# Patient Record
Sex: Male | Born: 1940 | Race: White | Hispanic: No | Marital: Single | Smoking: Never smoker
Health system: Southern US, Community
[De-identification: ages and names within clinical notes are randomized; demographics above are authoritative.]

## PROBLEM LIST (undated history)

## (undated) DIAGNOSIS — I4891 Unspecified atrial fibrillation: Secondary | ICD-10-CM

## (undated) DIAGNOSIS — M5430 Sciatica, unspecified side: Secondary | ICD-10-CM

## (undated) DIAGNOSIS — E119 Type 2 diabetes mellitus without complications: Secondary | ICD-10-CM

## (undated) DIAGNOSIS — I10 Essential (primary) hypertension: Secondary | ICD-10-CM

## (undated) DIAGNOSIS — I639 Cerebral infarction, unspecified: Secondary | ICD-10-CM

## (undated) HISTORY — PX: CHOLECYSTECTOMY: SHX55

---

## 2011-08-19 DIAGNOSIS — I10 Essential (primary) hypertension: Secondary | ICD-10-CM | POA: Diagnosis not present

## 2011-08-19 DIAGNOSIS — E7800 Pure hypercholesterolemia, unspecified: Secondary | ICD-10-CM | POA: Diagnosis not present

## 2011-08-19 DIAGNOSIS — J069 Acute upper respiratory infection, unspecified: Secondary | ICD-10-CM | POA: Diagnosis not present

## 2011-08-20 DIAGNOSIS — D539 Nutritional anemia, unspecified: Secondary | ICD-10-CM | POA: Diagnosis not present

## 2011-08-20 DIAGNOSIS — Z79899 Other long term (current) drug therapy: Secondary | ICD-10-CM | POA: Diagnosis not present

## 2011-08-27 DIAGNOSIS — I731 Thromboangiitis obliterans [Buerger's disease]: Secondary | ICD-10-CM | POA: Diagnosis not present

## 2011-08-27 DIAGNOSIS — M79609 Pain in unspecified limb: Secondary | ICD-10-CM | POA: Diagnosis not present

## 2011-08-27 DIAGNOSIS — E119 Type 2 diabetes mellitus without complications: Secondary | ICD-10-CM | POA: Diagnosis not present

## 2011-12-09 DIAGNOSIS — N289 Disorder of kidney and ureter, unspecified: Secondary | ICD-10-CM | POA: Diagnosis not present

## 2011-12-09 DIAGNOSIS — Z125 Encounter for screening for malignant neoplasm of prostate: Secondary | ICD-10-CM | POA: Diagnosis not present

## 2011-12-09 DIAGNOSIS — E782 Mixed hyperlipidemia: Secondary | ICD-10-CM | POA: Diagnosis not present

## 2012-01-15 DIAGNOSIS — I2510 Atherosclerotic heart disease of native coronary artery without angina pectoris: Secondary | ICD-10-CM | POA: Diagnosis not present

## 2012-01-15 DIAGNOSIS — I5030 Unspecified diastolic (congestive) heart failure: Secondary | ICD-10-CM | POA: Diagnosis not present

## 2012-01-15 DIAGNOSIS — I4892 Unspecified atrial flutter: Secondary | ICD-10-CM | POA: Diagnosis not present

## 2012-01-15 DIAGNOSIS — E119 Type 2 diabetes mellitus without complications: Secondary | ICD-10-CM | POA: Diagnosis not present

## 2012-03-04 DIAGNOSIS — Z23 Encounter for immunization: Secondary | ICD-10-CM | POA: Diagnosis not present

## 2012-03-15 DIAGNOSIS — R269 Unspecified abnormalities of gait and mobility: Secondary | ICD-10-CM | POA: Diagnosis not present

## 2012-03-15 DIAGNOSIS — K219 Gastro-esophageal reflux disease without esophagitis: Secondary | ICD-10-CM | POA: Diagnosis not present

## 2012-03-15 DIAGNOSIS — E1149 Type 2 diabetes mellitus with other diabetic neurological complication: Secondary | ICD-10-CM | POA: Diagnosis not present

## 2012-03-15 DIAGNOSIS — D539 Nutritional anemia, unspecified: Secondary | ICD-10-CM | POA: Diagnosis not present

## 2012-03-16 DIAGNOSIS — I999 Unspecified disorder of circulatory system: Secondary | ICD-10-CM | POA: Diagnosis not present

## 2012-03-16 DIAGNOSIS — R269 Unspecified abnormalities of gait and mobility: Secondary | ICD-10-CM | POA: Diagnosis not present

## 2012-04-20 DIAGNOSIS — IMO0002 Reserved for concepts with insufficient information to code with codable children: Secondary | ICD-10-CM | POA: Diagnosis not present

## 2012-04-20 DIAGNOSIS — E1142 Type 2 diabetes mellitus with diabetic polyneuropathy: Secondary | ICD-10-CM | POA: Diagnosis not present

## 2012-04-20 DIAGNOSIS — R269 Unspecified abnormalities of gait and mobility: Secondary | ICD-10-CM | POA: Diagnosis not present

## 2012-04-21 DIAGNOSIS — M5137 Other intervertebral disc degeneration, lumbosacral region: Secondary | ICD-10-CM | POA: Diagnosis not present

## 2012-04-27 DIAGNOSIS — IMO0002 Reserved for concepts with insufficient information to code with codable children: Secondary | ICD-10-CM | POA: Diagnosis not present

## 2012-04-27 DIAGNOSIS — M25569 Pain in unspecified knee: Secondary | ICD-10-CM | POA: Diagnosis not present

## 2012-04-27 DIAGNOSIS — E1142 Type 2 diabetes mellitus with diabetic polyneuropathy: Secondary | ICD-10-CM | POA: Diagnosis not present

## 2012-04-27 DIAGNOSIS — R269 Unspecified abnormalities of gait and mobility: Secondary | ICD-10-CM | POA: Diagnosis not present

## 2012-04-28 DIAGNOSIS — IMO0002 Reserved for concepts with insufficient information to code with codable children: Secondary | ICD-10-CM | POA: Diagnosis not present

## 2012-04-28 DIAGNOSIS — G722 Myopathy due to other toxic agents: Secondary | ICD-10-CM | POA: Diagnosis not present

## 2012-04-28 DIAGNOSIS — R269 Unspecified abnormalities of gait and mobility: Secondary | ICD-10-CM | POA: Diagnosis not present

## 2012-05-03 DIAGNOSIS — M25569 Pain in unspecified knee: Secondary | ICD-10-CM | POA: Diagnosis not present

## 2012-05-03 DIAGNOSIS — IMO0002 Reserved for concepts with insufficient information to code with codable children: Secondary | ICD-10-CM | POA: Diagnosis not present

## 2012-05-03 DIAGNOSIS — R269 Unspecified abnormalities of gait and mobility: Secondary | ICD-10-CM | POA: Diagnosis not present

## 2012-05-03 DIAGNOSIS — E1142 Type 2 diabetes mellitus with diabetic polyneuropathy: Secondary | ICD-10-CM | POA: Diagnosis not present

## 2012-05-05 DIAGNOSIS — G722 Myopathy due to other toxic agents: Secondary | ICD-10-CM | POA: Diagnosis not present

## 2012-05-05 DIAGNOSIS — R269 Unspecified abnormalities of gait and mobility: Secondary | ICD-10-CM | POA: Diagnosis not present

## 2012-05-05 DIAGNOSIS — IMO0002 Reserved for concepts with insufficient information to code with codable children: Secondary | ICD-10-CM | POA: Diagnosis not present

## 2012-05-06 DIAGNOSIS — M25569 Pain in unspecified knee: Secondary | ICD-10-CM | POA: Diagnosis not present

## 2012-05-06 DIAGNOSIS — IMO0002 Reserved for concepts with insufficient information to code with codable children: Secondary | ICD-10-CM | POA: Diagnosis not present

## 2012-05-06 DIAGNOSIS — E1142 Type 2 diabetes mellitus with diabetic polyneuropathy: Secondary | ICD-10-CM | POA: Diagnosis not present

## 2012-05-06 DIAGNOSIS — R269 Unspecified abnormalities of gait and mobility: Secondary | ICD-10-CM | POA: Diagnosis not present

## 2012-05-09 DIAGNOSIS — E1142 Type 2 diabetes mellitus with diabetic polyneuropathy: Secondary | ICD-10-CM | POA: Diagnosis not present

## 2012-05-09 DIAGNOSIS — IMO0002 Reserved for concepts with insufficient information to code with codable children: Secondary | ICD-10-CM | POA: Diagnosis not present

## 2012-05-09 DIAGNOSIS — R269 Unspecified abnormalities of gait and mobility: Secondary | ICD-10-CM | POA: Diagnosis not present

## 2012-05-09 DIAGNOSIS — G722 Myopathy due to other toxic agents: Secondary | ICD-10-CM | POA: Diagnosis not present

## 2012-05-10 DIAGNOSIS — M25569 Pain in unspecified knee: Secondary | ICD-10-CM | POA: Diagnosis not present

## 2012-05-10 DIAGNOSIS — E1142 Type 2 diabetes mellitus with diabetic polyneuropathy: Secondary | ICD-10-CM | POA: Diagnosis not present

## 2012-05-10 DIAGNOSIS — IMO0002 Reserved for concepts with insufficient information to code with codable children: Secondary | ICD-10-CM | POA: Diagnosis not present

## 2012-05-10 DIAGNOSIS — R269 Unspecified abnormalities of gait and mobility: Secondary | ICD-10-CM | POA: Diagnosis not present

## 2012-05-12 DIAGNOSIS — E1142 Type 2 diabetes mellitus with diabetic polyneuropathy: Secondary | ICD-10-CM | POA: Diagnosis not present

## 2012-05-12 DIAGNOSIS — M25569 Pain in unspecified knee: Secondary | ICD-10-CM | POA: Diagnosis not present

## 2012-05-12 DIAGNOSIS — IMO0002 Reserved for concepts with insufficient information to code with codable children: Secondary | ICD-10-CM | POA: Diagnosis not present

## 2012-05-12 DIAGNOSIS — R269 Unspecified abnormalities of gait and mobility: Secondary | ICD-10-CM | POA: Diagnosis not present

## 2012-05-17 DIAGNOSIS — R269 Unspecified abnormalities of gait and mobility: Secondary | ICD-10-CM | POA: Diagnosis not present

## 2012-05-17 DIAGNOSIS — E1142 Type 2 diabetes mellitus with diabetic polyneuropathy: Secondary | ICD-10-CM | POA: Diagnosis not present

## 2012-05-17 DIAGNOSIS — IMO0002 Reserved for concepts with insufficient information to code with codable children: Secondary | ICD-10-CM | POA: Diagnosis not present

## 2012-05-17 DIAGNOSIS — M25569 Pain in unspecified knee: Secondary | ICD-10-CM | POA: Diagnosis not present

## 2012-05-24 DIAGNOSIS — R269 Unspecified abnormalities of gait and mobility: Secondary | ICD-10-CM | POA: Diagnosis not present

## 2012-05-24 DIAGNOSIS — E1142 Type 2 diabetes mellitus with diabetic polyneuropathy: Secondary | ICD-10-CM | POA: Diagnosis not present

## 2012-05-24 DIAGNOSIS — IMO0002 Reserved for concepts with insufficient information to code with codable children: Secondary | ICD-10-CM | POA: Diagnosis not present

## 2012-05-24 DIAGNOSIS — M25569 Pain in unspecified knee: Secondary | ICD-10-CM | POA: Diagnosis not present

## 2012-05-31 DIAGNOSIS — R269 Unspecified abnormalities of gait and mobility: Secondary | ICD-10-CM | POA: Diagnosis not present

## 2012-05-31 DIAGNOSIS — E1142 Type 2 diabetes mellitus with diabetic polyneuropathy: Secondary | ICD-10-CM | POA: Diagnosis not present

## 2012-05-31 DIAGNOSIS — IMO0002 Reserved for concepts with insufficient information to code with codable children: Secondary | ICD-10-CM | POA: Diagnosis not present

## 2012-05-31 DIAGNOSIS — M25569 Pain in unspecified knee: Secondary | ICD-10-CM | POA: Diagnosis not present

## 2012-06-29 DIAGNOSIS — IMO0002 Reserved for concepts with insufficient information to code with codable children: Secondary | ICD-10-CM | POA: Diagnosis not present

## 2012-06-29 DIAGNOSIS — G722 Myopathy due to other toxic agents: Secondary | ICD-10-CM | POA: Diagnosis not present

## 2012-06-29 DIAGNOSIS — R269 Unspecified abnormalities of gait and mobility: Secondary | ICD-10-CM | POA: Diagnosis not present

## 2012-06-29 DIAGNOSIS — E1142 Type 2 diabetes mellitus with diabetic polyneuropathy: Secondary | ICD-10-CM | POA: Diagnosis not present

## 2012-10-13 DIAGNOSIS — E119 Type 2 diabetes mellitus without complications: Secondary | ICD-10-CM | POA: Diagnosis not present

## 2012-10-13 DIAGNOSIS — E7800 Pure hypercholesterolemia, unspecified: Secondary | ICD-10-CM | POA: Diagnosis not present

## 2012-10-13 DIAGNOSIS — I10 Essential (primary) hypertension: Secondary | ICD-10-CM | POA: Diagnosis not present

## 2012-10-24 DIAGNOSIS — N289 Disorder of kidney and ureter, unspecified: Secondary | ICD-10-CM | POA: Diagnosis not present

## 2012-11-02 DIAGNOSIS — N289 Disorder of kidney and ureter, unspecified: Secondary | ICD-10-CM | POA: Diagnosis not present

## 2012-11-11 DIAGNOSIS — R7989 Other specified abnormal findings of blood chemistry: Secondary | ICD-10-CM | POA: Diagnosis not present

## 2012-12-09 DIAGNOSIS — H524 Presbyopia: Secondary | ICD-10-CM | POA: Diagnosis not present

## 2012-12-09 DIAGNOSIS — E119 Type 2 diabetes mellitus without complications: Secondary | ICD-10-CM | POA: Diagnosis not present

## 2012-12-09 DIAGNOSIS — H52229 Regular astigmatism, unspecified eye: Secondary | ICD-10-CM | POA: Diagnosis not present

## 2012-12-09 DIAGNOSIS — H5200 Hypermetropia, unspecified eye: Secondary | ICD-10-CM | POA: Diagnosis not present

## 2013-02-13 DIAGNOSIS — E119 Type 2 diabetes mellitus without complications: Secondary | ICD-10-CM | POA: Diagnosis not present

## 2013-02-13 DIAGNOSIS — I5030 Unspecified diastolic (congestive) heart failure: Secondary | ICD-10-CM | POA: Diagnosis not present

## 2013-02-13 DIAGNOSIS — I4892 Unspecified atrial flutter: Secondary | ICD-10-CM | POA: Diagnosis not present

## 2013-02-13 DIAGNOSIS — I2510 Atherosclerotic heart disease of native coronary artery without angina pectoris: Secondary | ICD-10-CM | POA: Diagnosis not present

## 2013-02-13 DIAGNOSIS — E785 Hyperlipidemia, unspecified: Secondary | ICD-10-CM | POA: Diagnosis not present

## 2013-03-07 DIAGNOSIS — I2510 Atherosclerotic heart disease of native coronary artery without angina pectoris: Secondary | ICD-10-CM | POA: Diagnosis not present

## 2013-03-07 DIAGNOSIS — I4892 Unspecified atrial flutter: Secondary | ICD-10-CM | POA: Diagnosis not present

## 2013-03-22 DIAGNOSIS — Z23 Encounter for immunization: Secondary | ICD-10-CM | POA: Diagnosis not present

## 2013-06-28 DIAGNOSIS — I10 Essential (primary) hypertension: Secondary | ICD-10-CM | POA: Diagnosis not present

## 2013-06-28 DIAGNOSIS — IMO0001 Reserved for inherently not codable concepts without codable children: Secondary | ICD-10-CM | POA: Diagnosis not present

## 2013-06-28 DIAGNOSIS — E7800 Pure hypercholesterolemia, unspecified: Secondary | ICD-10-CM | POA: Diagnosis not present

## 2013-07-06 DIAGNOSIS — IMO0001 Reserved for inherently not codable concepts without codable children: Secondary | ICD-10-CM | POA: Diagnosis not present

## 2013-07-06 DIAGNOSIS — N189 Chronic kidney disease, unspecified: Secondary | ICD-10-CM | POA: Diagnosis not present

## 2013-07-06 DIAGNOSIS — I10 Essential (primary) hypertension: Secondary | ICD-10-CM | POA: Diagnosis not present

## 2013-08-09 DIAGNOSIS — IMO0001 Reserved for inherently not codable concepts without codable children: Secondary | ICD-10-CM | POA: Diagnosis not present

## 2013-08-09 DIAGNOSIS — N189 Chronic kidney disease, unspecified: Secondary | ICD-10-CM | POA: Diagnosis not present

## 2013-08-09 DIAGNOSIS — I10 Essential (primary) hypertension: Secondary | ICD-10-CM | POA: Diagnosis not present

## 2013-09-06 DIAGNOSIS — IMO0001 Reserved for inherently not codable concepts without codable children: Secondary | ICD-10-CM | POA: Diagnosis not present

## 2013-09-06 DIAGNOSIS — N189 Chronic kidney disease, unspecified: Secondary | ICD-10-CM | POA: Diagnosis not present

## 2013-09-06 DIAGNOSIS — I10 Essential (primary) hypertension: Secondary | ICD-10-CM | POA: Diagnosis not present

## 2013-09-06 DIAGNOSIS — Z0000 Encounter for general adult medical examination without abnormal findings: Secondary | ICD-10-CM | POA: Diagnosis not present

## 2013-09-21 DIAGNOSIS — IMO0001 Reserved for inherently not codable concepts without codable children: Secondary | ICD-10-CM | POA: Diagnosis not present

## 2013-09-21 DIAGNOSIS — H6120 Impacted cerumen, unspecified ear: Secondary | ICD-10-CM | POA: Diagnosis not present

## 2013-09-21 DIAGNOSIS — D239 Other benign neoplasm of skin, unspecified: Secondary | ICD-10-CM | POA: Diagnosis not present

## 2013-09-21 DIAGNOSIS — N189 Chronic kidney disease, unspecified: Secondary | ICD-10-CM | POA: Diagnosis not present

## 2013-09-21 DIAGNOSIS — I10 Essential (primary) hypertension: Secondary | ICD-10-CM | POA: Diagnosis not present

## 2013-09-21 DIAGNOSIS — Z23 Encounter for immunization: Secondary | ICD-10-CM | POA: Diagnosis not present

## 2013-10-30 DIAGNOSIS — E1129 Type 2 diabetes mellitus with other diabetic kidney complication: Secondary | ICD-10-CM | POA: Diagnosis not present

## 2013-10-30 DIAGNOSIS — E1165 Type 2 diabetes mellitus with hyperglycemia: Secondary | ICD-10-CM | POA: Diagnosis not present

## 2013-10-30 DIAGNOSIS — E785 Hyperlipidemia, unspecified: Secondary | ICD-10-CM | POA: Diagnosis not present

## 2013-10-30 DIAGNOSIS — N183 Chronic kidney disease, stage 3 unspecified: Secondary | ICD-10-CM | POA: Diagnosis not present

## 2013-11-02 DIAGNOSIS — I2510 Atherosclerotic heart disease of native coronary artery without angina pectoris: Secondary | ICD-10-CM | POA: Diagnosis not present

## 2013-11-02 DIAGNOSIS — E1142 Type 2 diabetes mellitus with diabetic polyneuropathy: Secondary | ICD-10-CM | POA: Diagnosis not present

## 2013-11-02 DIAGNOSIS — N183 Chronic kidney disease, stage 3 unspecified: Secondary | ICD-10-CM | POA: Diagnosis not present

## 2013-11-02 DIAGNOSIS — I509 Heart failure, unspecified: Secondary | ICD-10-CM | POA: Diagnosis not present

## 2013-11-02 DIAGNOSIS — E782 Mixed hyperlipidemia: Secondary | ICD-10-CM | POA: Diagnosis not present

## 2013-11-02 DIAGNOSIS — E1129 Type 2 diabetes mellitus with other diabetic kidney complication: Secondary | ICD-10-CM | POA: Diagnosis not present

## 2013-11-02 DIAGNOSIS — I10 Essential (primary) hypertension: Secondary | ICD-10-CM | POA: Diagnosis not present

## 2013-12-01 DIAGNOSIS — M999 Biomechanical lesion, unspecified: Secondary | ICD-10-CM | POA: Diagnosis not present

## 2013-12-01 DIAGNOSIS — IMO0002 Reserved for concepts with insufficient information to code with codable children: Secondary | ICD-10-CM | POA: Diagnosis not present

## 2013-12-05 DIAGNOSIS — I10 Essential (primary) hypertension: Secondary | ICD-10-CM | POA: Diagnosis not present

## 2013-12-05 DIAGNOSIS — I2510 Atherosclerotic heart disease of native coronary artery without angina pectoris: Secondary | ICD-10-CM | POA: Diagnosis not present

## 2013-12-05 DIAGNOSIS — E782 Mixed hyperlipidemia: Secondary | ICD-10-CM | POA: Diagnosis not present

## 2013-12-05 DIAGNOSIS — E1165 Type 2 diabetes mellitus with hyperglycemia: Secondary | ICD-10-CM | POA: Diagnosis not present

## 2013-12-05 DIAGNOSIS — E1142 Type 2 diabetes mellitus with diabetic polyneuropathy: Secondary | ICD-10-CM | POA: Diagnosis not present

## 2013-12-05 DIAGNOSIS — E1129 Type 2 diabetes mellitus with other diabetic kidney complication: Secondary | ICD-10-CM | POA: Diagnosis not present

## 2013-12-05 DIAGNOSIS — I509 Heart failure, unspecified: Secondary | ICD-10-CM | POA: Diagnosis not present

## 2013-12-05 DIAGNOSIS — N183 Chronic kidney disease, stage 3 unspecified: Secondary | ICD-10-CM | POA: Diagnosis not present

## 2013-12-08 DIAGNOSIS — E1129 Type 2 diabetes mellitus with other diabetic kidney complication: Secondary | ICD-10-CM | POA: Diagnosis not present

## 2013-12-08 DIAGNOSIS — N183 Chronic kidney disease, stage 3 unspecified: Secondary | ICD-10-CM | POA: Diagnosis not present

## 2013-12-08 DIAGNOSIS — I129 Hypertensive chronic kidney disease with stage 1 through stage 4 chronic kidney disease, or unspecified chronic kidney disease: Secondary | ICD-10-CM | POA: Diagnosis not present

## 2013-12-11 DIAGNOSIS — IMO0002 Reserved for concepts with insufficient information to code with codable children: Secondary | ICD-10-CM | POA: Diagnosis not present

## 2013-12-11 DIAGNOSIS — M999 Biomechanical lesion, unspecified: Secondary | ICD-10-CM | POA: Diagnosis not present

## 2013-12-19 DIAGNOSIS — N189 Chronic kidney disease, unspecified: Secondary | ICD-10-CM | POA: Diagnosis not present

## 2013-12-19 DIAGNOSIS — E1129 Type 2 diabetes mellitus with other diabetic kidney complication: Secondary | ICD-10-CM | POA: Diagnosis not present

## 2013-12-19 DIAGNOSIS — I129 Hypertensive chronic kidney disease with stage 1 through stage 4 chronic kidney disease, or unspecified chronic kidney disease: Secondary | ICD-10-CM | POA: Diagnosis not present

## 2013-12-19 DIAGNOSIS — N183 Chronic kidney disease, stage 3 unspecified: Secondary | ICD-10-CM | POA: Diagnosis not present

## 2013-12-28 DIAGNOSIS — M5430 Sciatica, unspecified side: Secondary | ICD-10-CM | POA: Diagnosis not present

## 2014-01-05 DIAGNOSIS — M545 Low back pain, unspecified: Secondary | ICD-10-CM | POA: Diagnosis not present

## 2014-01-08 DIAGNOSIS — M545 Low back pain, unspecified: Secondary | ICD-10-CM | POA: Diagnosis not present

## 2014-01-11 DIAGNOSIS — M545 Low back pain, unspecified: Secondary | ICD-10-CM | POA: Diagnosis not present

## 2014-01-15 DIAGNOSIS — M545 Low back pain, unspecified: Secondary | ICD-10-CM | POA: Diagnosis not present

## 2014-01-18 DIAGNOSIS — M545 Low back pain, unspecified: Secondary | ICD-10-CM | POA: Diagnosis not present

## 2014-01-22 DIAGNOSIS — M545 Low back pain, unspecified: Secondary | ICD-10-CM | POA: Diagnosis not present

## 2014-01-25 DIAGNOSIS — M545 Low back pain, unspecified: Secondary | ICD-10-CM | POA: Diagnosis not present

## 2014-01-29 DIAGNOSIS — M545 Low back pain, unspecified: Secondary | ICD-10-CM | POA: Diagnosis not present

## 2014-02-01 DIAGNOSIS — M545 Low back pain, unspecified: Secondary | ICD-10-CM | POA: Diagnosis not present

## 2014-02-05 DIAGNOSIS — M545 Low back pain, unspecified: Secondary | ICD-10-CM | POA: Diagnosis not present

## 2014-02-07 DIAGNOSIS — R809 Proteinuria, unspecified: Secondary | ICD-10-CM | POA: Diagnosis not present

## 2014-02-07 DIAGNOSIS — N183 Chronic kidney disease, stage 3 unspecified: Secondary | ICD-10-CM | POA: Diagnosis not present

## 2014-02-07 DIAGNOSIS — I129 Hypertensive chronic kidney disease with stage 1 through stage 4 chronic kidney disease, or unspecified chronic kidney disease: Secondary | ICD-10-CM | POA: Diagnosis not present

## 2014-02-07 DIAGNOSIS — E1129 Type 2 diabetes mellitus with other diabetic kidney complication: Secondary | ICD-10-CM | POA: Diagnosis not present

## 2014-02-08 DIAGNOSIS — M545 Low back pain, unspecified: Secondary | ICD-10-CM | POA: Diagnosis not present

## 2014-02-12 DIAGNOSIS — M545 Low back pain, unspecified: Secondary | ICD-10-CM | POA: Diagnosis not present

## 2014-02-15 DIAGNOSIS — M545 Low back pain, unspecified: Secondary | ICD-10-CM | POA: Diagnosis not present

## 2014-02-19 DIAGNOSIS — M545 Low back pain, unspecified: Secondary | ICD-10-CM | POA: Diagnosis not present

## 2014-02-22 DIAGNOSIS — M545 Low back pain, unspecified: Secondary | ICD-10-CM | POA: Diagnosis not present

## 2014-02-28 DIAGNOSIS — M545 Low back pain, unspecified: Secondary | ICD-10-CM | POA: Diagnosis not present

## 2014-03-02 DIAGNOSIS — M545 Low back pain, unspecified: Secondary | ICD-10-CM | POA: Diagnosis not present

## 2014-03-06 DIAGNOSIS — R0602 Shortness of breath: Secondary | ICD-10-CM | POA: Diagnosis not present

## 2014-03-06 DIAGNOSIS — E785 Hyperlipidemia, unspecified: Secondary | ICD-10-CM | POA: Diagnosis not present

## 2014-03-06 DIAGNOSIS — Z79899 Other long term (current) drug therapy: Secondary | ICD-10-CM | POA: Diagnosis not present

## 2014-03-06 DIAGNOSIS — I110 Hypertensive heart disease with heart failure: Secondary | ICD-10-CM | POA: Diagnosis not present

## 2014-03-06 DIAGNOSIS — I4892 Unspecified atrial flutter: Secondary | ICD-10-CM | POA: Diagnosis not present

## 2014-03-06 DIAGNOSIS — Z7901 Long term (current) use of anticoagulants: Secondary | ICD-10-CM | POA: Diagnosis not present

## 2014-03-06 DIAGNOSIS — I5032 Chronic diastolic (congestive) heart failure: Secondary | ICD-10-CM | POA: Diagnosis not present

## 2014-03-06 DIAGNOSIS — E119 Type 2 diabetes mellitus without complications: Secondary | ICD-10-CM | POA: Diagnosis not present

## 2014-03-06 DIAGNOSIS — I2510 Atherosclerotic heart disease of native coronary artery without angina pectoris: Secondary | ICD-10-CM | POA: Diagnosis not present

## 2014-03-06 DIAGNOSIS — I509 Heart failure, unspecified: Secondary | ICD-10-CM | POA: Diagnosis not present

## 2014-03-09 DIAGNOSIS — M545 Low back pain, unspecified: Secondary | ICD-10-CM | POA: Diagnosis not present

## 2014-03-09 DIAGNOSIS — Z23 Encounter for immunization: Secondary | ICD-10-CM | POA: Diagnosis not present

## 2014-06-01 ENCOUNTER — Encounter (HOSPITAL_COMMUNITY): Payer: Self-pay | Admitting: Emergency Medicine

## 2014-06-01 ENCOUNTER — Emergency Department (HOSPITAL_COMMUNITY): Payer: Medicare Other

## 2014-06-01 ENCOUNTER — Inpatient Hospital Stay (HOSPITAL_COMMUNITY)
Admission: EM | Admit: 2014-06-01 | Discharge: 2014-06-12 | DRG: 472 | Disposition: A | Discharge status: Rehabilitation Facility | Payer: Medicare Other | Attending: Orthopedic Surgery | Admitting: Orthopedic Surgery

## 2014-06-01 DIAGNOSIS — S42211D Unspecified displaced fracture of surgical neck of right humerus, subsequent encounter for fracture with routine healing: Secondary | ICD-10-CM | POA: Diagnosis not present

## 2014-06-01 DIAGNOSIS — M542 Cervicalgia: Secondary | ICD-10-CM | POA: Diagnosis not present

## 2014-06-01 DIAGNOSIS — S3991XA Unspecified injury of abdomen, initial encounter: Secondary | ICD-10-CM | POA: Diagnosis not present

## 2014-06-01 DIAGNOSIS — R413 Other amnesia: Secondary | ICD-10-CM | POA: Diagnosis present

## 2014-06-01 DIAGNOSIS — R739 Hyperglycemia, unspecified: Secondary | ICD-10-CM | POA: Diagnosis not present

## 2014-06-01 DIAGNOSIS — I10 Essential (primary) hypertension: Secondary | ICD-10-CM | POA: Diagnosis present

## 2014-06-01 DIAGNOSIS — S4992XA Unspecified injury of left shoulder and upper arm, initial encounter: Secondary | ICD-10-CM | POA: Diagnosis not present

## 2014-06-01 DIAGNOSIS — N179 Acute kidney failure, unspecified: Secondary | ICD-10-CM | POA: Diagnosis present

## 2014-06-01 DIAGNOSIS — E1142 Type 2 diabetes mellitus with diabetic polyneuropathy: Secondary | ICD-10-CM | POA: Diagnosis present

## 2014-06-01 DIAGNOSIS — Z79899 Other long term (current) drug therapy: Secondary | ICD-10-CM | POA: Diagnosis not present

## 2014-06-01 DIAGNOSIS — S80211D Abrasion, right knee, subsequent encounter: Secondary | ICD-10-CM | POA: Diagnosis not present

## 2014-06-01 DIAGNOSIS — D62 Acute posthemorrhagic anemia: Secondary | ICD-10-CM | POA: Diagnosis not present

## 2014-06-01 DIAGNOSIS — S80212D Abrasion, left knee, subsequent encounter: Secondary | ICD-10-CM | POA: Diagnosis not present

## 2014-06-01 DIAGNOSIS — M5012 Cervical disc disorder with radiculopathy, mid-cervical region: Secondary | ICD-10-CM | POA: Diagnosis present

## 2014-06-01 DIAGNOSIS — N390 Urinary tract infection, site not specified: Secondary | ICD-10-CM | POA: Diagnosis not present

## 2014-06-01 DIAGNOSIS — I313 Pericardial effusion (noninflammatory): Secondary | ICD-10-CM | POA: Diagnosis present

## 2014-06-01 DIAGNOSIS — Z8673 Personal history of transient ischemic attack (TIA), and cerebral infarction without residual deficits: Secondary | ICD-10-CM

## 2014-06-01 DIAGNOSIS — E1140 Type 2 diabetes mellitus with diabetic neuropathy, unspecified: Secondary | ICD-10-CM | POA: Diagnosis not present

## 2014-06-01 DIAGNOSIS — M25519 Pain in unspecified shoulder: Secondary | ICD-10-CM

## 2014-06-01 DIAGNOSIS — E11649 Type 2 diabetes mellitus with hypoglycemia without coma: Secondary | ICD-10-CM | POA: Diagnosis not present

## 2014-06-01 DIAGNOSIS — I3139 Other pericardial effusion (noninflammatory): Secondary | ICD-10-CM

## 2014-06-01 DIAGNOSIS — M25511 Pain in right shoulder: Secondary | ICD-10-CM | POA: Diagnosis not present

## 2014-06-01 DIAGNOSIS — E119 Type 2 diabetes mellitus without complications: Secondary | ICD-10-CM

## 2014-06-01 DIAGNOSIS — S0081XA Abrasion of other part of head, initial encounter: Secondary | ICD-10-CM | POA: Diagnosis present

## 2014-06-01 DIAGNOSIS — K5900 Constipation, unspecified: Secondary | ICD-10-CM | POA: Diagnosis present

## 2014-06-01 DIAGNOSIS — I700 Atherosclerosis of aorta: Secondary | ICD-10-CM | POA: Diagnosis not present

## 2014-06-01 DIAGNOSIS — S42201S Unspecified fracture of upper end of right humerus, sequela: Secondary | ICD-10-CM | POA: Diagnosis not present

## 2014-06-01 DIAGNOSIS — E1165 Type 2 diabetes mellitus with hyperglycemia: Secondary | ICD-10-CM | POA: Diagnosis present

## 2014-06-01 DIAGNOSIS — S42401A Unspecified fracture of lower end of right humerus, initial encounter for closed fracture: Secondary | ICD-10-CM

## 2014-06-01 DIAGNOSIS — S42301A Unspecified fracture of shaft of humerus, right arm, initial encounter for closed fracture: Secondary | ICD-10-CM | POA: Diagnosis not present

## 2014-06-01 DIAGNOSIS — Y92410 Unspecified street and highway as the place of occurrence of the external cause: Secondary | ICD-10-CM

## 2014-06-01 DIAGNOSIS — S140XXD Concussion and edema of cervical spinal cord, subsequent encounter: Secondary | ICD-10-CM | POA: Diagnosis present

## 2014-06-01 DIAGNOSIS — S129XXA Fracture of neck, unspecified, initial encounter: Secondary | ICD-10-CM | POA: Diagnosis not present

## 2014-06-01 DIAGNOSIS — S199XXA Unspecified injury of neck, initial encounter: Secondary | ICD-10-CM | POA: Diagnosis not present

## 2014-06-01 DIAGNOSIS — S299XXA Unspecified injury of thorax, initial encounter: Secondary | ICD-10-CM | POA: Diagnosis not present

## 2014-06-01 DIAGNOSIS — I4891 Unspecified atrial fibrillation: Secondary | ICD-10-CM | POA: Diagnosis present

## 2014-06-01 DIAGNOSIS — S42351A Displaced comminuted fracture of shaft of humerus, right arm, initial encounter for closed fracture: Secondary | ICD-10-CM | POA: Diagnosis not present

## 2014-06-01 DIAGNOSIS — M47812 Spondylosis without myelopathy or radiculopathy, cervical region: Secondary | ICD-10-CM | POA: Diagnosis not present

## 2014-06-01 DIAGNOSIS — G629 Polyneuropathy, unspecified: Secondary | ICD-10-CM | POA: Diagnosis present

## 2014-06-01 DIAGNOSIS — I482 Chronic atrial fibrillation: Secondary | ICD-10-CM | POA: Diagnosis present

## 2014-06-01 DIAGNOSIS — S134XXA Sprain of ligaments of cervical spine, initial encounter: Secondary | ICD-10-CM | POA: Diagnosis present

## 2014-06-01 DIAGNOSIS — M79603 Pain in arm, unspecified: Secondary | ICD-10-CM | POA: Diagnosis not present

## 2014-06-01 DIAGNOSIS — T1490 Injury, unspecified: Secondary | ICD-10-CM | POA: Diagnosis not present

## 2014-06-01 DIAGNOSIS — M2578 Osteophyte, vertebrae: Secondary | ICD-10-CM | POA: Diagnosis present

## 2014-06-01 DIAGNOSIS — M549 Dorsalgia, unspecified: Secondary | ICD-10-CM | POA: Diagnosis not present

## 2014-06-01 DIAGNOSIS — M25512 Pain in left shoulder: Secondary | ICD-10-CM | POA: Diagnosis not present

## 2014-06-01 DIAGNOSIS — I319 Disease of pericardium, unspecified: Secondary | ICD-10-CM | POA: Diagnosis not present

## 2014-06-01 DIAGNOSIS — M4322 Fusion of spine, cervical region: Secondary | ICD-10-CM | POA: Diagnosis not present

## 2014-06-01 DIAGNOSIS — G4701 Insomnia due to medical condition: Secondary | ICD-10-CM | POA: Diagnosis present

## 2014-06-01 DIAGNOSIS — M79602 Pain in left arm: Secondary | ICD-10-CM

## 2014-06-01 DIAGNOSIS — S42291A Other displaced fracture of upper end of right humerus, initial encounter for closed fracture: Secondary | ICD-10-CM | POA: Diagnosis present

## 2014-06-01 DIAGNOSIS — E118 Type 2 diabetes mellitus with unspecified complications: Secondary | ICD-10-CM | POA: Diagnosis not present

## 2014-06-01 DIAGNOSIS — S42201D Unspecified fracture of upper end of right humerus, subsequent encounter for fracture with routine healing: Secondary | ICD-10-CM | POA: Diagnosis not present

## 2014-06-01 DIAGNOSIS — M5412 Radiculopathy, cervical region: Secondary | ICD-10-CM | POA: Diagnosis not present

## 2014-06-01 DIAGNOSIS — S42211A Unspecified displaced fracture of surgical neck of right humerus, initial encounter for closed fracture: Secondary | ICD-10-CM | POA: Diagnosis not present

## 2014-06-01 DIAGNOSIS — Z419 Encounter for procedure for purposes other than remedying health state, unspecified: Secondary | ICD-10-CM

## 2014-06-01 DIAGNOSIS — S42201B Unspecified fracture of upper end of right humerus, initial encounter for open fracture: Secondary | ICD-10-CM | POA: Diagnosis not present

## 2014-06-01 DIAGNOSIS — M5010 Cervical disc disorder with radiculopathy, unspecified cervical region: Secondary | ICD-10-CM | POA: Diagnosis not present

## 2014-06-01 DIAGNOSIS — S3993XA Unspecified injury of pelvis, initial encounter: Secondary | ICD-10-CM | POA: Diagnosis not present

## 2014-06-01 DIAGNOSIS — T148 Other injury of unspecified body region: Secondary | ICD-10-CM | POA: Diagnosis not present

## 2014-06-01 DIAGNOSIS — S42301D Unspecified fracture of shaft of humerus, right arm, subsequent encounter for fracture with routine healing: Secondary | ICD-10-CM | POA: Diagnosis not present

## 2014-06-01 DIAGNOSIS — S0003XD Contusion of scalp, subsequent encounter: Secondary | ICD-10-CM | POA: Diagnosis not present

## 2014-06-01 DIAGNOSIS — K5909 Other constipation: Secondary | ICD-10-CM | POA: Diagnosis not present

## 2014-06-01 DIAGNOSIS — S0990XA Unspecified injury of head, initial encounter: Secondary | ICD-10-CM | POA: Diagnosis not present

## 2014-06-01 HISTORY — DX: Essential (primary) hypertension: I10

## 2014-06-01 HISTORY — DX: Type 2 diabetes mellitus without complications: E11.9

## 2014-06-01 HISTORY — DX: Sciatica, unspecified side: M54.30

## 2014-06-01 HISTORY — DX: Unspecified atrial fibrillation: I48.91

## 2014-06-01 HISTORY — DX: Cerebral infarction, unspecified: I63.9

## 2014-06-01 LAB — COMPREHENSIVE METABOLIC PANEL
ALBUMIN: 3.6 g/dL (ref 3.5–5.2)
ALT: 22 U/L (ref 0–53)
AST: 24 U/L (ref 0–37)
Alkaline Phosphatase: 75 U/L (ref 39–117)
Anion gap: 15 (ref 5–15)
BUN: 27 mg/dL — ABNORMAL HIGH (ref 6–23)
CALCIUM: 10.2 mg/dL (ref 8.4–10.5)
CO2: 25 mEq/L (ref 19–32)
CREATININE: 1.45 mg/dL — AB (ref 0.50–1.35)
Chloride: 97 mEq/L (ref 96–112)
GFR calc Af Amer: 54 mL/min — ABNORMAL LOW (ref >90)
GFR calc non Af Amer: 47 mL/min — ABNORMAL LOW (ref >90)
Glucose, Bld: 416 mg/dL — ABNORMAL HIGH (ref 70–99)
Potassium: 4.3 mEq/L (ref 3.7–5.3)
Sodium: 137 mEq/L (ref 137–147)
TOTAL PROTEIN: 7.3 g/dL (ref 6.0–8.3)
Total Bilirubin: 0.5 mg/dL (ref 0.3–1.2)

## 2014-06-01 LAB — SAMPLE TO BLOOD BANK

## 2014-06-01 LAB — CBC
HEMATOCRIT: 44.3 % (ref 39.0–52.0)
HEMOGLOBIN: 15.5 g/dL (ref 13.0–17.0)
MCH: 31.1 pg (ref 26.0–34.0)
MCHC: 35 g/dL (ref 30.0–36.0)
MCV: 89 fL (ref 78.0–100.0)
Platelets: 172 10*3/uL (ref 150–400)
RBC: 4.98 MIL/uL (ref 4.22–5.81)
RDW: 12.8 % (ref 11.5–15.5)
WBC: 8.5 10*3/uL (ref 4.0–10.5)

## 2014-06-01 LAB — CDS SEROLOGY

## 2014-06-01 LAB — I-STAT CG4 LACTIC ACID, ED: Lactic Acid, Venous: 1.75 mmol/L (ref 0.5–2.2)

## 2014-06-01 LAB — PROTIME-INR
INR: 1.3 (ref 0.00–1.49)
Prothrombin Time: 16.3 seconds — ABNORMAL HIGH (ref 11.6–15.2)

## 2014-06-01 LAB — ETHANOL: Alcohol, Ethyl (B): <11 mg/dL (ref 0–11)

## 2014-06-01 MED ORDER — HYDROMORPHONE HCL 1 MG/ML IJ SOLN
INTRAMUSCULAR | Status: AC
  Filled 2014-06-01: qty 1

## 2014-06-01 MED ORDER — HYDROMORPHONE HCL 1 MG/ML IJ SOLN
1.0000 mg | Freq: Once | INTRAMUSCULAR | Status: AC
  Administered 2014-06-01: 1 mg via INTRAVENOUS

## 2014-06-01 MED ORDER — FENTANYL CITRATE 0.05 MG/ML IJ SOLN
50.0000 ug | Freq: Once | INTRAMUSCULAR | Status: AC
  Administered 2014-06-01: 50 ug via INTRAVENOUS

## 2014-06-01 MED ORDER — IOHEXOL 300 MG/ML  SOLN
100.0000 mL | Freq: Once | INTRAMUSCULAR | Status: AC | PRN
  Administered 2014-06-01: 100 mL via INTRAVENOUS

## 2014-06-01 MED ORDER — FENTANYL CITRATE 0.05 MG/ML IJ SOLN
INTRAMUSCULAR | Status: AC
  Administered 2014-06-01: 50 ug
  Filled 2014-06-01: qty 2

## 2014-06-01 NOTE — ED Notes (Signed)
Vital signs stable. 

## 2014-06-01 NOTE — ED Notes (Signed)
Family inquiring about   What the pt is waiting on. c-t results and doctors
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
## 2014-06-01 NOTE — ED Notes (Signed)
Patient transported to CT 

## 2014-06-01 NOTE — ED Notes (Signed)
Portable xrays being done

## 2014-06-01 NOTE — ED Notes (Signed)
Family to bedside - updated by RN

## 2014-06-01 NOTE — ED Notes (Signed)
Pt's wallet and keys, clothes given to daughter Henrick Mcgue - [place]

## 2014-06-01 NOTE — Progress Notes (Signed)
I spoke to the emergency room doctor about this patient.  He has a markedly displaced surgical neck fracture of the proximal humerus.  The emergency room physician has evaluated the patient and feels that he is completely neurovascularly intact.  The patient is awake and alert and able to move her wrist and fingers to command.  This will definitely need surgical intervention for a minimum closed reduction and pinning but more likely close reduction and plate versus intramedullary rod fixation.  The patient has questionable head injury and there is questionable issues with his cervical spine.  I will await definitive evaluation of these areas and once he is cleared he will need surgical intervention.  This can be done in a delayed fashion on the patient is more appropriate for surgery.  He will currently be placed in a coaptation splint and I will evaluate him fully in the morning.
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
## 2014-06-01 NOTE — ED Notes (Signed)
Called lab for status of CMP

## 2014-06-02 ENCOUNTER — Encounter (HOSPITAL_COMMUNITY): Payer: Self-pay | Admitting: *Deleted

## 2014-06-02 ENCOUNTER — Inpatient Hospital Stay (HOSPITAL_COMMUNITY): Payer: Medicare Other

## 2014-06-02 DIAGNOSIS — M542 Cervicalgia: Secondary | ICD-10-CM | POA: Diagnosis not present

## 2014-06-02 DIAGNOSIS — E1165 Type 2 diabetes mellitus with hyperglycemia: Secondary | ICD-10-CM

## 2014-06-02 DIAGNOSIS — M5412 Radiculopathy, cervical region: Secondary | ICD-10-CM | POA: Diagnosis not present

## 2014-06-02 DIAGNOSIS — M5012 Cervical disc disorder with radiculopathy, mid-cervical region: Secondary | ICD-10-CM | POA: Diagnosis present

## 2014-06-02 DIAGNOSIS — S4992XA Unspecified injury of left shoulder and upper arm, initial encounter: Secondary | ICD-10-CM | POA: Diagnosis not present

## 2014-06-02 DIAGNOSIS — S42401A Unspecified fracture of lower end of right humerus, initial encounter for closed fracture: Secondary | ICD-10-CM

## 2014-06-02 DIAGNOSIS — M2578 Osteophyte, vertebrae: Secondary | ICD-10-CM | POA: Diagnosis present

## 2014-06-02 DIAGNOSIS — Y92410 Unspecified street and highway as the place of occurrence of the external cause: Secondary | ICD-10-CM | POA: Diagnosis not present

## 2014-06-02 DIAGNOSIS — S0081XA Abrasion of other part of head, initial encounter: Secondary | ICD-10-CM | POA: Diagnosis present

## 2014-06-02 DIAGNOSIS — S42201D Unspecified fracture of upper end of right humerus, subsequent encounter for fracture with routine healing: Secondary | ICD-10-CM | POA: Diagnosis not present

## 2014-06-02 DIAGNOSIS — M5010 Cervical disc disorder with radiculopathy, unspecified cervical region: Secondary | ICD-10-CM | POA: Diagnosis not present

## 2014-06-02 DIAGNOSIS — D62 Acute posthemorrhagic anemia: Secondary | ICD-10-CM | POA: Diagnosis not present

## 2014-06-02 DIAGNOSIS — E118 Type 2 diabetes mellitus with unspecified complications: Secondary | ICD-10-CM | POA: Diagnosis not present

## 2014-06-02 DIAGNOSIS — M25512 Pain in left shoulder: Secondary | ICD-10-CM | POA: Diagnosis not present

## 2014-06-02 DIAGNOSIS — S134XXA Sprain of ligaments of cervical spine, initial encounter: Secondary | ICD-10-CM | POA: Diagnosis present

## 2014-06-02 DIAGNOSIS — K5900 Constipation, unspecified: Secondary | ICD-10-CM | POA: Diagnosis present

## 2014-06-02 DIAGNOSIS — S42351A Displaced comminuted fracture of shaft of humerus, right arm, initial encounter for closed fracture: Secondary | ICD-10-CM | POA: Diagnosis not present

## 2014-06-02 DIAGNOSIS — I4891 Unspecified atrial fibrillation: Secondary | ICD-10-CM | POA: Diagnosis not present

## 2014-06-02 DIAGNOSIS — T1490 Injury, unspecified: Secondary | ICD-10-CM

## 2014-06-02 DIAGNOSIS — S199XXA Unspecified injury of neck, initial encounter: Secondary | ICD-10-CM | POA: Diagnosis not present

## 2014-06-02 DIAGNOSIS — M25511 Pain in right shoulder: Secondary | ICD-10-CM | POA: Diagnosis present

## 2014-06-02 DIAGNOSIS — S42301D Unspecified fracture of shaft of humerus, right arm, subsequent encounter for fracture with routine healing: Secondary | ICD-10-CM | POA: Diagnosis not present

## 2014-06-02 DIAGNOSIS — G4701 Insomnia due to medical condition: Secondary | ICD-10-CM | POA: Diagnosis present

## 2014-06-02 DIAGNOSIS — S140XXD Concussion and edema of cervical spinal cord, subsequent encounter: Secondary | ICD-10-CM | POA: Diagnosis present

## 2014-06-02 DIAGNOSIS — M549 Dorsalgia, unspecified: Secondary | ICD-10-CM | POA: Diagnosis not present

## 2014-06-02 DIAGNOSIS — S42301A Unspecified fracture of shaft of humerus, right arm, initial encounter for closed fracture: Secondary | ICD-10-CM | POA: Diagnosis not present

## 2014-06-02 DIAGNOSIS — S0003XD Contusion of scalp, subsequent encounter: Secondary | ICD-10-CM | POA: Diagnosis not present

## 2014-06-02 DIAGNOSIS — I482 Chronic atrial fibrillation: Secondary | ICD-10-CM | POA: Diagnosis not present

## 2014-06-02 DIAGNOSIS — N179 Acute kidney failure, unspecified: Secondary | ICD-10-CM | POA: Diagnosis not present

## 2014-06-02 DIAGNOSIS — M4322 Fusion of spine, cervical region: Secondary | ICD-10-CM | POA: Diagnosis not present

## 2014-06-02 DIAGNOSIS — I313 Pericardial effusion (noninflammatory): Secondary | ICD-10-CM | POA: Diagnosis not present

## 2014-06-02 DIAGNOSIS — S80212D Abrasion, left knee, subsequent encounter: Secondary | ICD-10-CM | POA: Diagnosis not present

## 2014-06-02 DIAGNOSIS — E1140 Type 2 diabetes mellitus with diabetic neuropathy, unspecified: Secondary | ICD-10-CM | POA: Diagnosis not present

## 2014-06-02 DIAGNOSIS — S129XXA Fracture of neck, unspecified, initial encounter: Secondary | ICD-10-CM | POA: Diagnosis not present

## 2014-06-02 DIAGNOSIS — S42291A Other displaced fracture of upper end of right humerus, initial encounter for closed fracture: Secondary | ICD-10-CM | POA: Diagnosis not present

## 2014-06-02 DIAGNOSIS — R413 Other amnesia: Secondary | ICD-10-CM | POA: Diagnosis present

## 2014-06-02 DIAGNOSIS — S299XXA Unspecified injury of thorax, initial encounter: Secondary | ICD-10-CM | POA: Diagnosis not present

## 2014-06-02 DIAGNOSIS — G629 Polyneuropathy, unspecified: Secondary | ICD-10-CM | POA: Diagnosis present

## 2014-06-02 DIAGNOSIS — S80211D Abrasion, right knee, subsequent encounter: Secondary | ICD-10-CM | POA: Diagnosis not present

## 2014-06-02 DIAGNOSIS — E119 Type 2 diabetes mellitus without complications: Secondary | ICD-10-CM | POA: Diagnosis not present

## 2014-06-02 DIAGNOSIS — K5909 Other constipation: Secondary | ICD-10-CM | POA: Diagnosis not present

## 2014-06-02 DIAGNOSIS — E11649 Type 2 diabetes mellitus with hypoglycemia without coma: Secondary | ICD-10-CM | POA: Diagnosis not present

## 2014-06-02 DIAGNOSIS — M47812 Spondylosis without myelopathy or radiculopathy, cervical region: Secondary | ICD-10-CM | POA: Diagnosis not present

## 2014-06-02 DIAGNOSIS — S42201S Unspecified fracture of upper end of right humerus, sequela: Secondary | ICD-10-CM | POA: Diagnosis not present

## 2014-06-02 DIAGNOSIS — S42201B Unspecified fracture of upper end of right humerus, initial encounter for open fracture: Secondary | ICD-10-CM | POA: Diagnosis not present

## 2014-06-02 DIAGNOSIS — S42211A Unspecified displaced fracture of surgical neck of right humerus, initial encounter for closed fracture: Secondary | ICD-10-CM | POA: Diagnosis not present

## 2014-06-02 DIAGNOSIS — E1142 Type 2 diabetes mellitus with diabetic polyneuropathy: Secondary | ICD-10-CM | POA: Diagnosis present

## 2014-06-02 DIAGNOSIS — I10 Essential (primary) hypertension: Secondary | ICD-10-CM | POA: Diagnosis not present

## 2014-06-02 DIAGNOSIS — N390 Urinary tract infection, site not specified: Secondary | ICD-10-CM | POA: Diagnosis not present

## 2014-06-02 DIAGNOSIS — S42211D Unspecified displaced fracture of surgical neck of right humerus, subsequent encounter for fracture with routine healing: Secondary | ICD-10-CM | POA: Diagnosis not present

## 2014-06-02 DIAGNOSIS — Z8673 Personal history of transient ischemic attack (TIA), and cerebral infarction without residual deficits: Secondary | ICD-10-CM | POA: Diagnosis not present

## 2014-06-02 DIAGNOSIS — Z79899 Other long term (current) drug therapy: Secondary | ICD-10-CM | POA: Diagnosis not present

## 2014-06-02 DIAGNOSIS — I319 Disease of pericardium, unspecified: Secondary | ICD-10-CM | POA: Diagnosis not present

## 2014-06-02 LAB — BASIC METABOLIC PANEL
Anion gap: 14 (ref 5–15)
BUN: 26 mg/dL — AB (ref 6–23)
CO2: 20 mEq/L (ref 19–32)
CREATININE: 1.39 mg/dL — AB (ref 0.50–1.35)
Calcium: 7.5 mg/dL — ABNORMAL LOW (ref 8.4–10.5)
Chloride: 108 mEq/L (ref 96–112)
GFR calc Af Amer: 57 mL/min — ABNORMAL LOW (ref >90)
GFR calc non Af Amer: 49 mL/min — ABNORMAL LOW (ref >90)
Glucose, Bld: 433 mg/dL — ABNORMAL HIGH (ref 70–99)
Potassium: 3.8 mEq/L (ref 3.7–5.3)
Sodium: 142 mEq/L (ref 137–147)

## 2014-06-02 LAB — CBG MONITORING, ED
Glucose-Capillary: 469 mg/dL — ABNORMAL HIGH (ref 70–99)
Glucose-Capillary: 414 mg/dL — ABNORMAL HIGH (ref 70–99)
Glucose-Capillary: 327 mg/dL — ABNORMAL HIGH (ref 70–99)

## 2014-06-02 LAB — GLUCOSE, CAPILLARY
GLUCOSE-CAPILLARY: 319 mg/dL — AB (ref 70–99)
GLUCOSE-CAPILLARY: 243 mg/dL — AB (ref 70–99)
Glucose-Capillary: 270 mg/dL — ABNORMAL HIGH (ref 70–99)

## 2014-06-02 LAB — MRSA PCR SCREENING: MRSA BY PCR: NEGATIVE

## 2014-06-02 LAB — CBC
HEMATOCRIT: 25.9 % — AB (ref 39.0–52.0)
Hemoglobin: 8.6 g/dL — ABNORMAL LOW (ref 13.0–17.0)
MCH: 30.2 pg (ref 26.0–34.0)
MCHC: 33.2 g/dL (ref 30.0–36.0)
MCV: 90.9 fL (ref 78.0–100.0)
PLATELETS: 119 10*3/uL — AB (ref 150–400)
RBC: 2.85 MIL/uL — ABNORMAL LOW (ref 4.22–5.81)
RDW: 13.1 % (ref 11.5–15.5)
WBC: 9.6 10*3/uL (ref 4.0–10.5)

## 2014-06-02 LAB — TROPONIN I: Troponin I: <0.3 ng/mL (ref <0.30)

## 2014-06-02 MED ORDER — AMLODIPINE BESYLATE 10 MG PO TABS
10.0000 mg | ORAL_TABLET | Freq: Every evening | ORAL | Status: DC
  Administered 2014-06-02 – 2014-06-11 (×9): 10 mg via ORAL
  Filled 2014-06-02 (×13): qty 1

## 2014-06-02 MED ORDER — ONDANSETRON HCL 4 MG PO TABS: 4.0000 mg | ORAL_TABLET | Freq: Four times a day (QID) | ORAL | Status: DC | PRN

## 2014-06-02 MED ORDER — DEXTROSE-NACL 5-0.9 % IV SOLN
INTRAVENOUS | Status: DC
  Administered 2014-06-02: 08:00:00 via INTRAVENOUS

## 2014-06-02 MED ORDER — MORPHINE SULFATE 2 MG/ML IJ SOLN
2.0000 mg | INTRAMUSCULAR | Status: DC | PRN
  Administered 2014-06-02 (×2): 2 mg via INTRAVENOUS
  Filled 2014-06-02 (×2): qty 1

## 2014-06-02 MED ORDER — DIGOXIN 125 MCG PO TABS
0.1250 mg | ORAL_TABLET | Freq: Every day | ORAL | Status: DC
  Administered 2014-06-02 – 2014-06-11 (×11): 0.125 mg via ORAL
  Filled 2014-06-02 (×14): qty 1

## 2014-06-02 MED ORDER — CARVEDILOL 12.5 MG PO TABS
12.5000 mg | ORAL_TABLET | Freq: Two times a day (BID) | ORAL | Status: DC
  Administered 2014-06-02 – 2014-06-12 (×20): 12.5 mg via ORAL
  Filled 2014-06-02 (×29): qty 1

## 2014-06-02 MED ORDER — SODIUM CHLORIDE 0.9 % IV SOLN
INTRAVENOUS | Status: DC
  Administered 2014-06-02 – 2014-06-04 (×3): via INTRAVENOUS

## 2014-06-02 MED ORDER — ACETAMINOPHEN 325 MG PO TABS: 650.0000 mg | ORAL_TABLET | Freq: Four times a day (QID) | ORAL | Status: DC | PRN

## 2014-06-02 MED ORDER — INSULIN ASPART 100 UNIT/ML ~~LOC~~ SOLN
0.0000 [IU] | Freq: Three times a day (TID) | SUBCUTANEOUS | Status: DC
  Administered 2014-06-02: 7 [IU] via SUBCUTANEOUS
  Administered 2014-06-03 – 2014-06-04 (×3): 11 [IU] via SUBCUTANEOUS
  Administered 2014-06-04: 4 [IU] via SUBCUTANEOUS
  Administered 2014-06-04 – 2014-06-05 (×3): 7 [IU] via SUBCUTANEOUS
  Administered 2014-06-05: 4 [IU] via SUBCUTANEOUS
  Administered 2014-06-06 (×2): 7 [IU] via SUBCUTANEOUS
  Administered 2014-06-06: 4 [IU] via SUBCUTANEOUS
  Administered 2014-06-07 (×2): 3 [IU] via SUBCUTANEOUS
  Administered 2014-06-07 – 2014-06-08 (×2): 4 [IU] via SUBCUTANEOUS
  Administered 2014-06-08 – 2014-06-12 (×6): 3 [IU] via SUBCUTANEOUS

## 2014-06-02 MED ORDER — LISINOPRIL 20 MG PO TABS: 20.0000 mg | ORAL_TABLET | Freq: Every day | ORAL | Status: DC

## 2014-06-02 MED ORDER — SODIUM CHLORIDE 0.9 % IV BOLUS (SEPSIS)
1000.0000 mL | Freq: Once | INTRAVENOUS | Status: AC
  Administered 2014-06-01: 1000 mL via INTRAVENOUS

## 2014-06-02 MED ORDER — CEFAZOLIN SODIUM-DEXTROSE 2-3 GM-% IV SOLR
2.0000 g | INTRAVENOUS | Status: DC
  Filled 2014-06-02: qty 50

## 2014-06-02 MED ORDER — MORPHINE SULFATE 2 MG/ML IJ SOLN
2.0000 mg | INTRAMUSCULAR | Status: DC | PRN
  Administered 2014-06-02 – 2014-06-03 (×2): 2 mg via INTRAVENOUS
  Administered 2014-06-04: 4 mg via INTRAVENOUS
  Administered 2014-06-04: 2 mg via INTRAVENOUS
  Administered 2014-06-04: 4 mg via INTRAVENOUS
  Administered 2014-06-05 – 2014-06-06 (×3): 2 mg via INTRAVENOUS
  Administered 2014-06-06: 4 mg via INTRAVENOUS
  Administered 2014-06-06: 2 mg via INTRAVENOUS
  Administered 2014-06-06: 4 mg via INTRAVENOUS
  Administered 2014-06-07: 2 mg via INTRAVENOUS
  Administered 2014-06-07: 4 mg via INTRAVENOUS
  Administered 2014-06-07: 2 mg via INTRAVENOUS
  Filled 2014-06-02: qty 1
  Filled 2014-06-02: qty 2
  Filled 2014-06-02 (×3): qty 1
  Filled 2014-06-02: qty 2
  Filled 2014-06-02 (×2): qty 1
  Filled 2014-06-02: qty 2
  Filled 2014-06-02: qty 1
  Filled 2014-06-02: qty 2
  Filled 2014-06-02: qty 1
  Filled 2014-06-02: qty 2
  Filled 2014-06-02 (×2): qty 1

## 2014-06-02 MED ORDER — SODIUM CHLORIDE 0.9 % IV SOLN
Freq: Once | INTRAVENOUS | Status: AC
  Administered 2014-06-02: 09:00:00 via INTRAVENOUS

## 2014-06-02 MED ORDER — INSULIN ASPART 100 UNIT/ML ~~LOC~~ SOLN
0.0000 [IU] | SUBCUTANEOUS | Status: DC
  Administered 2014-06-02: 15 [IU] via SUBCUTANEOUS
  Filled 2014-06-02: qty 1

## 2014-06-02 MED ORDER — OXYCODONE HCL 5 MG PO TABS
5.0000 mg | ORAL_TABLET | ORAL | Status: DC | PRN
  Administered 2014-06-02: 10 mg via ORAL
  Administered 2014-06-02: 5 mg via ORAL
  Administered 2014-06-03 – 2014-06-07 (×10): 10 mg via ORAL
  Filled 2014-06-02 (×12): qty 2

## 2014-06-02 MED ORDER — CHLORHEXIDINE GLUCONATE 4 % EX LIQD
60.0000 mL | Freq: Once | CUTANEOUS | Status: AC
  Administered 2014-06-03: 4 via TOPICAL
  Filled 2014-06-02: qty 60

## 2014-06-02 MED ORDER — ONDANSETRON HCL 4 MG/2ML IJ SOLN: 4.0000 mg | Freq: Four times a day (QID) | INTRAMUSCULAR | Status: DC | PRN

## 2014-06-02 MED ORDER — HYDROMORPHONE HCL 1 MG/ML IJ SOLN
1.0000 mg | Freq: Once | INTRAMUSCULAR | Status: AC
  Administered 2014-06-02: 1 mg via INTRAVENOUS

## 2014-06-02 MED ORDER — HYDROMORPHONE HCL 1 MG/ML IJ SOLN
INTRAMUSCULAR | Status: AC
  Filled 2014-06-02: qty 1

## 2014-06-02 MED ORDER — DEXTROSE-NACL 5-0.9 % IV SOLN
INTRAVENOUS | Status: DC
  Administered 2014-06-02: 04:00:00 via INTRAVENOUS

## 2014-06-02 MED ORDER — SODIUM CHLORIDE 0.9 % IV BOLUS (SEPSIS)
1000.0000 mL | Freq: Once | INTRAVENOUS | Status: AC
  Administered 2014-06-02: 1000 mL via INTRAVENOUS

## 2014-06-02 MED ORDER — INSULIN ASPART 100 UNIT/ML ~~LOC~~ SOLN
0.0000 [IU] | SUBCUTANEOUS | Status: DC
  Administered 2014-06-02 (×2): 15 [IU] via SUBCUTANEOUS
  Filled 2014-06-02 (×2): qty 1

## 2014-06-02 NOTE — Progress Notes (Signed)
Adjusted C-collar.  Cleared for surgery tomorrow.  Stable otherwise.  Kathryne Eriksson. Dahlia Bailiff, MD, [place] [phone] Trauma Surgeon

## 2014-06-02 NOTE — Progress Notes (Signed)
*  PRELIMINARY RESULTS* Echocardiogram 2D Echocardiogram has been performed.  Leavy Cella 06/02/2014, 4:33 PM

## 2014-06-02 NOTE — ED Notes (Signed)
Pt moved to a regular hosp bed  And to rm  c 29.  No stepdown beds

## 2014-06-02 NOTE — ED Notes (Signed)
The pt is alert  Still has pain.  Family at bedisde.   Insulin given   15 units.  The pt has not been checking his sugar at home and he has nit been taking his meds according to his family.  Ad in talking to family because they are upset at the delays.  No sd bed available now

## 2014-06-02 NOTE — ED Notes (Signed)
PER SHANNON, FLOW MANAGER, TRAUMA ADMITTING PT. [facility] HOSPLIST CONSULTED. SHANNON PAGING DR [facility] SO RN MAY ADVISE OF PT'S CBG.
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
## 2014-06-02 NOTE — ED Notes (Signed)
ATTEMPTED TO CALL REPORT X 2.
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
## 2014-06-02 NOTE — ED Notes (Signed)
pts son cell number   [phone]
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
## 2014-06-02 NOTE — ED Notes (Signed)
Pt's family in room waiting for pt to return.
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
## 2014-06-02 NOTE — ED Notes (Signed)
Dr. Lee to bedside.
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
## 2014-06-02 NOTE — ED Notes (Addendum)
D5-0.9NS IV FLUIDS STOPPED AT THIS TIME D/T CBG 414. [place] GROSE, CHARGE RN IN AGREEMENT. WILL ADVISE HOSPLIST/ADMITTING MD WHEN ARRIVE TO ED.

## 2014-06-02 NOTE — Progress Notes (Signed)
Dr. Hulen Skains called back. New order to replace c-collar.

## 2014-06-02 NOTE — ED Notes (Signed)
DR GRAVES IN W/PT AND FAMILY.

## 2014-06-02 NOTE — ED Notes (Signed)
Pt son Delfino Lovett [phone] to notified of changes
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
## 2014-06-02 NOTE — Progress Notes (Signed)
Subjective:     Patient reports pain as severe.    Objective: Vital signs in last 24 hours: Temp:  [97.9 F (36.6 C)-98.9 F (37.2 C)] 98.3 F (36.8 C) (12/12 1220) Pulse Rate:  [41-125] 45 (12/12 1220) Resp:  [14-21] 16 (12/12 1220) BP: (92-188)/(34-123) 129/75 mmHg (12/12 1220) SpO2:  [95 %-100 %] 97 % (12/12 1220) Weight:  [190 lb (86.183 kg)-195 lb 15.8 oz (88.9 kg)] 195 lb 15.8 oz (88.9 kg) (12/12 1220)  Intake/Output from previous day: 12/11 0701 - 12/12 0700 In: 2000 [I.V.:2000] Out: 525 [Urine:525] Intake/Output this shift: Total I/O In: 825 [P.O.:600; I.V.:225] Out: 225 [Urine:225]   Recent Labs  06/01/14 2016 06/02/14 0315  HGB 15.5 8.6*    Recent Labs  06/01/14 2016 06/02/14 0315  WBC 8.5 9.6  RBC 4.98 2.85*  HCT 44.3 25.9*  PLT 172 119*    Recent Labs  06/01/14 2016 06/02/14 0315  NA 137 142  K 4.3 3.8  CL 97 108  CO2 25 20  BUN 27* 26*  CREATININE 1.45* 1.39*  GLUCOSE 416* 433*  CALCIUM 10.2 7.5*    Recent Labs  06/01/14 2016  INR 1.30    Neurologically intact Neurovascular intact Sensation intact distally Intact pulses distally No cellulitis present Compartment soft  Assessment/Plan:    We'll plan open reduction and internal fixation of right proximal humerus fracture tomorrow.  We'll allow neurosurgery to evaluate and provide treatment considerations for his cervical spine issues.  n.p.o. After midnight.  [name] 06/02/2014, 3:49 PM
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
## 2014-06-02 NOTE — ED Notes (Signed)
ATTEMPTED TO CALL REPORT - PER SECRETARY, TRACY, RN, WILL CALL BACK.

## 2014-06-03 ENCOUNTER — Inpatient Hospital Stay (HOSPITAL_COMMUNITY): Payer: Medicare Other

## 2014-06-03 ENCOUNTER — Encounter (HOSPITAL_COMMUNITY): Admission: EM | Disposition: A | Discharge status: Rehabilitation Facility | Payer: Self-pay | Source: Home / Self Care

## 2014-06-03 ENCOUNTER — Encounter (HOSPITAL_COMMUNITY): Payer: Self-pay | Admitting: Anesthesiology

## 2014-06-03 ENCOUNTER — Inpatient Hospital Stay (HOSPITAL_COMMUNITY): Payer: Medicare Other | Admitting: Anesthesiology

## 2014-06-03 HISTORY — PX: ORIF SHOULDER FRACTURE: SHX5035

## 2014-06-03 LAB — DIGOXIN LEVEL: Digoxin Level: 1.2 ng/mL (ref 0.8–2.0)

## 2014-06-03 LAB — GLUCOSE, CAPILLARY
GLUCOSE-CAPILLARY: 291 mg/dL — AB (ref 70–99)
GLUCOSE-CAPILLARY: 253 mg/dL — AB (ref 70–99)
Glucose-Capillary: 255 mg/dL — ABNORMAL HIGH (ref 70–99)
Glucose-Capillary: 258 mg/dL — ABNORMAL HIGH (ref 70–99)

## 2014-06-03 LAB — COMPREHENSIVE METABOLIC PANEL
ALT: 33 U/L (ref 0–53)
ANION GAP: 11 (ref 5–15)
AST: 59 U/L — ABNORMAL HIGH (ref 0–37)
Albumin: 2.5 g/dL — ABNORMAL LOW (ref 3.5–5.2)
Alkaline Phosphatase: 46 U/L (ref 39–117)
BUN: 37 mg/dL — AB (ref 6–23)
CO2: 23 mEq/L (ref 19–32)
CREATININE: 1.66 mg/dL — AB (ref 0.50–1.35)
Calcium: 8.8 mg/dL (ref 8.4–10.5)
Chloride: 102 mEq/L (ref 96–112)
GFR calc non Af Amer: 40 mL/min — ABNORMAL LOW (ref >90)
GFR, EST AFRICAN AMERICAN: 46 mL/min — AB (ref >90)
Glucose, Bld: 269 mg/dL — ABNORMAL HIGH (ref 70–99)
POTASSIUM: 4 meq/L (ref 3.7–5.3)
Sodium: 136 mEq/L — ABNORMAL LOW (ref 137–147)
TOTAL PROTEIN: 5.3 g/dL — AB (ref 6.0–8.3)
Total Bilirubin: 0.6 mg/dL (ref 0.3–1.2)

## 2014-06-03 LAB — CBC
HCT: 30.9 % — ABNORMAL LOW (ref 39.0–52.0)
HEMOGLOBIN: 10 g/dL — AB (ref 13.0–17.0)
MCH: 29.9 pg (ref 26.0–34.0)
MCHC: 32.4 g/dL (ref 30.0–36.0)
MCV: 92.5 fL (ref 78.0–100.0)
PLATELETS: 135 10*3/uL — AB (ref 150–400)
RBC: 3.34 MIL/uL — AB (ref 4.22–5.81)
RDW: 13.5 % (ref 11.5–15.5)
WBC: 8.7 10*3/uL (ref 4.0–10.5)

## 2014-06-03 LAB — POCT I-STAT GLUCOSE
GLUCOSE: 189 mg/dL — AB (ref 70–99)
OPERATOR ID: 117071

## 2014-06-03 SURGERY — OPEN REDUCTION INTERNAL FIXATION (ORIF) SHOULDER FRACTURE
Anesthesia: General | Site: Shoulder | Laterality: Right

## 2014-06-03 MED ORDER — OXYCODONE HCL 5 MG/5ML PO SOLN
5.0000 mg | Freq: Once | ORAL | Status: AC | PRN
Start: 2014-06-03 — End: 2014-06-03

## 2014-06-03 MED ORDER — SUCCINYLCHOLINE CHLORIDE 20 MG/ML IJ SOLN
INTRAMUSCULAR | Status: AC
  Filled 2014-06-03: qty 1

## 2014-06-03 MED ORDER — 0.9 % SODIUM CHLORIDE (POUR BTL) OPTIME
TOPICAL | Status: DC | PRN
  Administered 2014-06-03: 1000 mL

## 2014-06-03 MED ORDER — ONDANSETRON HCL 4 MG/2ML IJ SOLN
INTRAMUSCULAR | Status: DC | PRN
  Administered 2014-06-03: 4 mg via INTRAVENOUS

## 2014-06-03 MED ORDER — ARTIFICIAL TEARS OP OINT
TOPICAL_OINTMENT | OPHTHALMIC | Status: AC
  Filled 2014-06-03: qty 3.5

## 2014-06-03 MED ORDER — LIDOCAINE HCL (CARDIAC) 20 MG/ML IV SOLN
INTRAVENOUS | Status: AC
  Filled 2014-06-03: qty 5

## 2014-06-03 MED ORDER — PROPOFOL 10 MG/ML IV BOLUS
INTRAVENOUS | Status: AC
  Filled 2014-06-03: qty 20

## 2014-06-03 MED ORDER — NEOSTIGMINE METHYLSULFATE 10 MG/10ML IV SOLN
INTRAVENOUS | Status: AC
  Filled 2014-06-03: qty 1

## 2014-06-03 MED ORDER — FENTANYL CITRATE 0.05 MG/ML IJ SOLN
INTRAMUSCULAR | Status: AC
  Filled 2014-06-03: qty 5

## 2014-06-03 MED ORDER — LACTATED RINGERS IV SOLN
INTRAVENOUS | Status: DC | PRN
  Administered 2014-06-03: 12:00:00 via INTRAVENOUS

## 2014-06-03 MED ORDER — ONDANSETRON HCL 4 MG/2ML IJ SOLN: INTRAMUSCULAR | Status: DC | PRN

## 2014-06-03 MED ORDER — FENTANYL CITRATE 0.05 MG/ML IJ SOLN
INTRAMUSCULAR | Status: DC | PRN
  Administered 2014-06-03 (×5): 50 ug via INTRAVENOUS

## 2014-06-03 MED ORDER — MIDAZOLAM HCL 5 MG/5ML IJ SOLN
INTRAMUSCULAR | Status: DC | PRN
  Administered 2014-06-03 (×2): 1 mg via INTRAVENOUS

## 2014-06-03 MED ORDER — CEFAZOLIN SODIUM-DEXTROSE 2-3 GM-% IV SOLR
INTRAVENOUS | Status: DC | PRN
  Administered 2014-06-03: 2 g via INTRAVENOUS

## 2014-06-03 MED ORDER — SUCCINYLCHOLINE CHLORIDE 20 MG/ML IJ SOLN
INTRAMUSCULAR | Status: DC | PRN
  Administered 2014-06-03: 100 mg via INTRAVENOUS

## 2014-06-03 MED ORDER — ONDANSETRON HCL 4 MG PO TABS: 4.0000 mg | ORAL_TABLET | Freq: Four times a day (QID) | ORAL | Status: DC | PRN

## 2014-06-03 MED ORDER — SODIUM CHLORIDE 0.9 % IV SOLN
INTRAVENOUS | Status: DC | PRN
  Administered 2014-06-03 (×2): via INTRAVENOUS

## 2014-06-03 MED ORDER — BUPIVACAINE-EPINEPHRINE (PF) 0.5% -1:200000 IJ SOLN
INTRAMUSCULAR | Status: AC
  Filled 2014-06-03: qty 30

## 2014-06-03 MED ORDER — DEXAMETHASONE SODIUM PHOSPHATE 4 MG/ML IJ SOLN
INTRAMUSCULAR | Status: DC | PRN
  Administered 2014-06-03: 4 mg via INTRAVENOUS

## 2014-06-03 MED ORDER — MIDAZOLAM HCL 2 MG/2ML IJ SOLN
INTRAMUSCULAR | Status: AC
  Filled 2014-06-03: qty 2

## 2014-06-03 MED ORDER — ONDANSETRON HCL 4 MG/2ML IJ SOLN
INTRAMUSCULAR | Status: AC
  Filled 2014-06-03: qty 2

## 2014-06-03 MED ORDER — GLYCOPYRROLATE 0.2 MG/ML IJ SOLN
INTRAMUSCULAR | Status: AC
  Filled 2014-06-03: qty 3

## 2014-06-03 MED ORDER — DABIGATRAN ETEXILATE MESYLATE 75 MG PO CAPS
75.0000 mg | ORAL_CAPSULE | Freq: Every day | ORAL | Status: DC
  Administered 2014-06-04 – 2014-06-06 (×3): 75 mg via ORAL
  Filled 2014-06-03 (×4): qty 1

## 2014-06-03 MED ORDER — OXYCODONE HCL 5 MG PO TABS: 5.0000 mg | ORAL_TABLET | Freq: Once | ORAL | Status: AC | PRN

## 2014-06-03 MED ORDER — PHENYLEPHRINE HCL 10 MG/ML IJ SOLN
10.0000 mg | INTRAVENOUS | Status: DC | PRN
  Administered 2014-06-03: 80 ug/min via INTRAVENOUS
  Administered 2014-06-03: 14:00:00 via INTRAVENOUS

## 2014-06-03 MED ORDER — DEXAMETHASONE SODIUM PHOSPHATE 4 MG/ML IJ SOLN
INTRAMUSCULAR | Status: AC
  Filled 2014-06-03: qty 1

## 2014-06-03 MED ORDER — LIDOCAINE HCL (CARDIAC) 20 MG/ML IV SOLN
INTRAVENOUS | Status: DC | PRN
  Administered 2014-06-03: 40 mg via INTRAVENOUS

## 2014-06-03 MED ORDER — BUPIVACAINE HCL 0.5 % IJ SOLN
INTRAMUSCULAR | Status: AC
  Filled 2014-06-03: qty 1

## 2014-06-03 MED ORDER — STERILE WATER FOR INJECTION IJ SOLN
INTRAMUSCULAR | Status: AC
  Filled 2014-06-03: qty 10

## 2014-06-03 MED ORDER — ROCURONIUM BROMIDE 100 MG/10ML IV SOLN
INTRAVENOUS | Status: DC | PRN
  Administered 2014-06-03: 15 mg via INTRAVENOUS
  Administered 2014-06-03: 25 mg via INTRAVENOUS

## 2014-06-03 MED ORDER — INSULIN GLARGINE 100 UNIT/ML ~~LOC~~ SOLN
10.0000 [IU] | Freq: Every day | SUBCUTANEOUS | Status: DC
  Administered 2014-06-03: 10 [IU] via SUBCUTANEOUS
  Filled 2014-06-03 (×2): qty 0.1

## 2014-06-03 MED ORDER — ROCURONIUM BROMIDE 50 MG/5ML IV SOLN
INTRAVENOUS | Status: AC
  Filled 2014-06-03: qty 1

## 2014-06-03 MED ORDER — ONDANSETRON HCL 4 MG/2ML IJ SOLN: 4.0000 mg | Freq: Four times a day (QID) | INTRAMUSCULAR | Status: DC | PRN

## 2014-06-03 MED ORDER — CEFAZOLIN SODIUM-DEXTROSE 2-3 GM-% IV SOLR
2.0000 g | Freq: Four times a day (QID) | INTRAVENOUS | Status: AC
  Administered 2014-06-03 – 2014-06-04 (×3): 2 g via INTRAVENOUS
  Filled 2014-06-03 (×3): qty 50

## 2014-06-03 MED ORDER — PROPOFOL 10 MG/ML IV BOLUS
INTRAVENOUS | Status: DC | PRN
  Administered 2014-06-03: 120 mg via INTRAVENOUS

## 2014-06-03 MED ORDER — HYDROMORPHONE HCL 1 MG/ML IJ SOLN: 0.2500 mg | INTRAMUSCULAR | Status: DC | PRN

## 2014-06-03 MED ORDER — EPHEDRINE SULFATE 50 MG/ML IJ SOLN
INTRAMUSCULAR | Status: AC
  Filled 2014-06-03: qty 1

## 2014-06-03 MED ORDER — BUPIVACAINE-EPINEPHRINE 0.5% -1:200000 IJ SOLN
INTRAMUSCULAR | Status: DC | PRN
  Administered 2014-06-03: 20 mL

## 2014-06-03 SURGICAL SUPPLY — 66 items
BIT DRILL 2.8X4 QC CORT (BIT) ×3 IMPLANT
BIT DRILL 4 LONG FAST STEP (BIT) ×3 IMPLANT
BIT DRILL 4 SHORT FAST STEP (BIT) ×3 IMPLANT
CLOSURE WOUND 1/4X4 (GAUZE/BANDAGES/DRESSINGS) ×1
DRAPE C-ARM 42X72 X-RAY (DRAPES) ×3 IMPLANT
DRAPE IMP U-DRAPE 54X76 (DRAPES) ×3 IMPLANT
DRAPE INCISE IOBAN 66X45 STRL (DRAPES) ×3 IMPLANT
DRAPE U-SHAPE 47X51 STRL (DRAPES) ×3 IMPLANT
DRSG MEPILEX BORDER 4X8 (GAUZE/BANDAGES/DRESSINGS) ×3 IMPLANT
DRSG PAD ABDOMINAL 8X10 ST (GAUZE/BANDAGES/DRESSINGS) ×6 IMPLANT
ELECT REM PT RETURN 9FT ADLT (ELECTROSURGICAL) ×3
ELECTRODE REM PT RTRN 9FT ADLT (ELECTROSURGICAL) ×1 IMPLANT
GAUZE SPONGE 4X4 12PLY STRL (GAUZE/BANDAGES/DRESSINGS) ×3 IMPLANT
GAUZE XEROFORM 1X8 LF (GAUZE/BANDAGES/DRESSINGS) ×3 IMPLANT
GLOVE BIO SURGEON STRL SZ7 (GLOVE) ×3 IMPLANT
GLOVE BIOGEL PI IND STRL 6.5 (GLOVE) ×5 IMPLANT
GLOVE BIOGEL PI IND STRL 7.0 (GLOVE) ×1 IMPLANT
GLOVE BIOGEL PI IND STRL 8 (GLOVE) ×2 IMPLANT
GLOVE BIOGEL PI INDICATOR 6.5 (GLOVE) ×10
GLOVE BIOGEL PI INDICATOR 7.0 (GLOVE) ×2
GLOVE BIOGEL PI INDICATOR 8 (GLOVE) ×4
GLOVE ECLIPSE 7.5 STRL STRAW (GLOVE) ×6 IMPLANT
GLOVE ORTHOPEDIC STR SZ6.5 (GLOVE) ×6 IMPLANT
GOWN PREVENTION PLUS XLARGE (GOWN DISPOSABLE) ×12 IMPLANT
GOWN STRL REUS W/ TWL LRG LVL3 (GOWN DISPOSABLE) ×1 IMPLANT
GOWN STRL REUS W/ TWL XL LVL3 (GOWN DISPOSABLE) ×2 IMPLANT
GOWN STRL REUS W/TWL LRG LVL3 (GOWN DISPOSABLE) ×2
GOWN STRL REUS W/TWL XL LVL3 (GOWN DISPOSABLE) ×4
IMMOBILIZER SHOULDER FOAM XLGE (SOFTGOODS) ×3 IMPLANT
KIT BASIN OR (CUSTOM PROCEDURE TRAY) ×3 IMPLANT
KIT ROOM TURNOVER OR (KITS) ×3 IMPLANT
MANIFOLD NEPTUNE II (INSTRUMENTS) ×3 IMPLANT
NEEDLE 22X1 1/2 (OR ONLY) (NEEDLE) IMPLANT
NS IRRIG 1000ML POUR BTL (IV SOLUTION) ×3 IMPLANT
PACK SHOULDER (CUSTOM PROCEDURE TRAY) ×3 IMPLANT
PACK UNIVERSAL I (CUSTOM PROCEDURE TRAY) ×3 IMPLANT
PAD ARMBOARD 7.5X6 YLW CONV (MISCELLANEOUS) ×6 IMPLANT
PEG STND 4.0X35MM (Orthopedic Implant) ×3 IMPLANT
PEG STND 4.0X40MM (Orthopedic Implant) ×3 IMPLANT
PEG STND 4.0X42.5MM (Orthopedic Implant) ×3 IMPLANT
PEG STND 4.0X45.0MM (Orthopedic Implant) ×3 IMPLANT
PEG STND 4.0X47.5MM (Orthopedic Implant) ×3 IMPLANT
PEG STND 4.0X52.5MM (Orthopedic Implant) ×3 IMPLANT
PEGSTD 4.0X35MM (Orthopedic Implant) ×1 IMPLANT
PEGSTD 4.0X40MM (Orthopedic Implant) ×1 IMPLANT
PEGSTD 4.0X42.5MM (Orthopedic Implant) ×1 IMPLANT
PEGSTD 4.0X45.0MM (Orthopedic Implant) ×1 IMPLANT
PEGSTD 4.0X47.5MM (Orthopedic Implant) ×1 IMPLANT
PLATE SHOULDER S3 3HOLE RT (Plate) ×3 IMPLANT
SCREW LOCK 90D ANGLED 3.8X30 (Screw) ×6 IMPLANT
SCREW MULTIDIR 3.8X30 HUMRL (Screw) ×3 IMPLANT
SPONGE LAP 4X18 X RAY DECT (DISPOSABLE) ×6 IMPLANT
STAPLER VISISTAT 35W (STAPLE) ×3 IMPLANT
STRIP CLOSURE SKIN 1/4X4 (GAUZE/BANDAGES/DRESSINGS) ×2 IMPLANT
SUCTION FRAZIER TIP 10 FR DISP (SUCTIONS) ×3 IMPLANT
SUT FIBERWIRE #2 38 T-5 BLUE (SUTURE) ×3
SUT VIC AB 0 CT1 27 (SUTURE) ×4
SUT VIC AB 0 CT1 27XBRD ANBCTR (SUTURE) ×2 IMPLANT
SUT VIC AB 2-0 CT1 27 (SUTURE) ×4
SUT VIC AB 2-0 CT1 TAPERPNT 27 (SUTURE) ×2 IMPLANT
SUTURE FIBERWR #2 38 T-5 BLUE (SUTURE) ×1 IMPLANT
SYR CONTROL 10ML LL (SYRINGE) IMPLANT
TOWEL OR 17X24 6PK STRL BLUE (TOWEL DISPOSABLE) ×3 IMPLANT
TOWEL OR 17X26 10 PK STRL BLUE (TOWEL DISPOSABLE) ×3 IMPLANT
WATER STERILE IRR 1000ML POUR (IV SOLUTION) ×3 IMPLANT
YANKAUER SUCT BULB TIP NO VENT (SUCTIONS) ×3 IMPLANT

## 2014-06-03 NOTE — Anesthesia Procedure Notes (Signed)
Procedure Name: Intubation Date/Time: 06/03/2014 12:06 PM Performed by: Marinda Elk A Pre-anesthesia Checklist: Patient identified, Emergency Drugs available, Suction available, Patient being monitored and Timeout performed Patient Re-evaluated:Patient Re-evaluated prior to inductionOxygen Delivery Method: Circle system utilized Preoxygenation: Pre-oxygenation with 100% oxygen (C-Collar on for procedure) Intubation Type: IV induction Ventilation: Mask ventilation without difficulty Tube type: Oral Tube size: 7.5 mm Number of attempts: 1 Airway Equipment and Method: Video-laryngoscopy Placement Confirmation: ETT inserted through vocal cords under direct vision,  positive ETCO2 and breath sounds checked- equal and bilateral Secured at: 21 cm Tube secured with: Tape Dental Injury: Teeth and Oropharynx as per pre-operative assessment
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
## 2014-06-03 NOTE — Progress Notes (Signed)
Patient ID: Gabriel Riley, male   DOB: 10/26/1940, 73 y.o.   MRN: [id number] Patient in [place] today. Preoperatively patient's neck and neuro condition stable ( Follow-up postop

## 2014-06-03 NOTE — Progress Notes (Signed)
Patient ID: Gabriel Riley, male   DOB: 09-22-1940, 74 y.o.   MRN: [id number]  Patient in operating room with Orthopedics for right humeral fracture.  No acute issues overnight.    The on-call surgeon will evaluate him later after surgery.  Imogene Burn. Georgette Dover, MD, [facility]  General/ Trauma Surgery  06/03/2014 11:17 AM

## 2014-06-03 NOTE — Progress Notes (Signed)
Pt hasn't voided since 1815  Pt claims he dind,t need to pee, scanned bladder 270 cc noted will continue to monitor.

## 2014-06-03 NOTE — Anesthesia Preprocedure Evaluation (Signed)
Anesthesia Evaluation  Patient identified by MRN, date of birth, ID band Patient awake    Reviewed: Allergy & Precautions, H&P , NPO status , Patient's Chart, lab work & pertinent test results, reviewed documented beta blocker date and time   Airway Mallampati: III  TM Distance: >3 FB Neck ROM: Limited  Mouth opening: Limited Mouth Opening  Dental no notable dental hx. (+) Teeth Intact, Dental Advisory Given   Pulmonary neg pulmonary ROS,  breath sounds clear to auscultation  Pulmonary exam normal       Cardiovascular hypertension, Pt. on medications and Pt. on home beta blockers Rhythm:Irregular Rate:Normal     Neuro/Psych L leg weak since CVA CVA, Residual Symptoms negative psych ROS   GI/Hepatic negative GI ROS, Neg liver ROS,   Endo/Other  diabetes, Insulin Dependent  Renal/GU negative Renal ROS  negative genitourinary   Musculoskeletal   Abdominal   Peds  Hematology negative hematology ROS (+)   Anesthesia Other Findings   Reproductive/Obstetrics negative OB ROS                             Anesthesia Physical Anesthesia Plan  ASA: III  Anesthesia Plan: General   Post-op Pain Management:    Induction: Intravenous  Airway Management Planned: Oral ETT and Video Laryngoscope Planned  Additional Equipment:   Intra-op Plan:   Post-operative Plan: Extubation in OR  Informed Consent: I have reviewed the patients History and Physical, chart, labs and discussed the procedure including the risks, benefits and alternatives for the proposed anesthesia with the patient or authorized representative who has indicated his/her understanding and acceptance.   Dental advisory given  Plan Discussed with: CRNA  Anesthesia Plan Comments:         Anesthesia Quick Evaluation

## 2014-06-03 NOTE — Progress Notes (Signed)
Pt voided 150 cc/of urine  Will continue to monitor.

## 2014-06-03 NOTE — Transfer of Care (Signed)
Immediate Anesthesia Transfer of Care Note  Patient: Gabriel Riley Pree  Procedure(s) Performed: Procedure(s): OPEN REDUCTION INTERNAL FIXATION (ORIF) SHOULDER FRACTURE (Right)  Patient Location: PACU  Anesthesia Type:General  Level of Consciousness: sedated  Airway & Oxygen Therapy: Patient Spontanous Breathing and Patient connected to nasal cannula oxygen  Post-op Assessment: Report given to PACU RN and Post -op Vital signs reviewed and stable  Post vital signs: Reviewed and stable  Complications: No apparent anesthesia complications
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
## 2014-06-03 NOTE — Anesthesia Postprocedure Evaluation (Signed)
  Anesthesia Post-op Note  Patient: Gabriel Riley  Procedure(s) Performed: Procedure(s): OPEN REDUCTION INTERNAL FIXATION (ORIF) SHOULDER FRACTURE (Right)  Patient Location: PACU  Anesthesia Type:General  Level of Consciousness: awake and alert   Airway and Oxygen Therapy: Patient Spontanous Breathing  Post-op Pain: none  Post-op Assessment: Post-op Vital signs reviewed, Patient's Cardiovascular Status Stable and Respiratory Function Stable  Post-op Vital Signs: Reviewed  Filed Vitals:   06/03/14 1600  BP: 143/63  Pulse: 78  Temp:   Resp: 13    Complications: No apparent anesthesia complications

## 2014-06-03 NOTE — Brief Op Note (Signed)
06/01/2014 - 06/03/2014  3:12 PM  PATIENT:  Gabriel Riley  73 y.o. male  PRE-OPERATIVE DIAGNOSIS:  right comminuted shoulder fracture  POST-OPERATIVE DIAGNOSIS:  right comminuted shoulder fracture  PROCEDURE:  Procedure(s): OPEN REDUCTION INTERNAL FIXATION (ORIF) SHOULDER FRACTURE (Right)  SURGEON:  Surgeon(s) and Role:    * Alta Corning, MD - Primary  PHYSICIAN ASSISTANT:   ASSISTANTS: bethune   ANESTHESIA:   general  EBL:  Total I/O In: 2775 [I.V.:2775] Out: 200 [Urine:100; Blood:100]  BLOOD ADMINISTERED:none  DRAINS: none   LOCAL MEDICATIONS USED:  MARCAINE     SPECIMEN:  No Specimen  DISPOSITION OF SPECIMEN:  N/A  COUNTS:  YES  TOURNIQUET:  * No tourniquets in log *  DICTATION: .Other Dictation: Dictation Number [id number]  PLAN OF CARE: Admit to inpatient   PATIENT DISPOSITION:  PACU - hemodynamically stable.   Delay start of Pharmacological VTE agent (>24hrs) due to surgical blood loss or risk of bleeding: no

## 2014-06-04 ENCOUNTER — Inpatient Hospital Stay (HOSPITAL_COMMUNITY): Payer: Medicare Other

## 2014-06-04 ENCOUNTER — Encounter (HOSPITAL_COMMUNITY): Payer: Self-pay | Admitting: Orthopedic Surgery

## 2014-06-04 LAB — COMPREHENSIVE METABOLIC PANEL
ALBUMIN: 2.6 g/dL — AB (ref 3.5–5.2)
ALK PHOS: 55 U/L (ref 39–117)
ALT: 27 U/L (ref 0–53)
AST: 46 U/L — ABNORMAL HIGH (ref 0–37)
Anion gap: 12 (ref 5–15)
BUN: 34 mg/dL — AB (ref 6–23)
CO2: 21 mEq/L (ref 19–32)
Calcium: 8.4 mg/dL (ref 8.4–10.5)
Chloride: 107 mEq/L (ref 96–112)
Creatinine, Ser: 1.32 mg/dL (ref 0.50–1.35)
GFR calc Af Amer: 61 mL/min — ABNORMAL LOW (ref >90)
GFR calc non Af Amer: 52 mL/min — ABNORMAL LOW (ref >90)
Glucose, Bld: 260 mg/dL — ABNORMAL HIGH (ref 70–99)
POTASSIUM: 4.8 meq/L (ref 3.7–5.3)
Sodium: 140 mEq/L (ref 137–147)
TOTAL PROTEIN: 5.7 g/dL — AB (ref 6.0–8.3)
Total Bilirubin: 0.4 mg/dL (ref 0.3–1.2)

## 2014-06-04 LAB — CBC
HCT: 30.6 % — ABNORMAL LOW (ref 39.0–52.0)
Hemoglobin: 10.1 g/dL — ABNORMAL LOW (ref 13.0–17.0)
MCH: 30.8 pg (ref 26.0–34.0)
MCHC: 33 g/dL (ref 30.0–36.0)
MCV: 93.3 fL (ref 78.0–100.0)
PLATELETS: 137 10*3/uL — AB (ref 150–400)
RBC: 3.28 MIL/uL — AB (ref 4.22–5.81)
RDW: 13.5 % (ref 11.5–15.5)
WBC: 8.1 10*3/uL (ref 4.0–10.5)

## 2014-06-04 LAB — GLUCOSE, CAPILLARY
GLUCOSE-CAPILLARY: 265 mg/dL — AB (ref 70–99)
Glucose-Capillary: 243 mg/dL — ABNORMAL HIGH (ref 70–99)
Glucose-Capillary: 198 mg/dL — ABNORMAL HIGH (ref 70–99)
Glucose-Capillary: 171 mg/dL — ABNORMAL HIGH (ref 70–99)

## 2014-06-04 MED ORDER — PREGABALIN 75 MG PO CAPS
75.0000 mg | ORAL_CAPSULE | Freq: Two times a day (BID) | ORAL | Status: DC
  Administered 2014-06-04 – 2014-06-12 (×16): 75 mg via ORAL
  Filled 2014-06-04 (×16): qty 1

## 2014-06-04 MED ORDER — INSULIN GLARGINE 100 UNIT/ML ~~LOC~~ SOLN
16.0000 [IU] | Freq: Every day | SUBCUTANEOUS | Status: DC
  Administered 2014-06-04: 16 [IU] via SUBCUTANEOUS
  Filled 2014-06-04 (×2): qty 0.16

## 2014-06-04 NOTE — Progress Notes (Signed)
Pt c/o tingling in bilateral hands, no numbness. Dr. Grandville Silos and ortho MD aware, no new orders received. No s/s of acute distress noted.

## 2014-06-04 NOTE — Progress Notes (Signed)
Received orders to transfer pt, pt aware of transfer. VS stable, no s/s of acute distress noted. Awaiting bed assignment.

## 2014-06-04 NOTE — Progress Notes (Signed)
Patient ID: Gabriel Riley, male   DOB: Feb 04, 1941, 73 y.o.   MRN: [id number] Called because patient is having severe left arm pain when sitting up. Any form of stress on the left arm create creates substantial pain and discomfort.  Patient did have cervical disruption at C6-C7. He has been in a hard cervical collar. I like to obtain a plain cervical spine x-ray to see if there is any listhesis at the 67 level.

## 2014-06-04 NOTE — Progress Notes (Signed)
Inpatient Diabetes Program Recommendations  AACE/ADA: New Consensus Statement on Inpatient Glycemic Control (2013)  Target Ranges:  Prepandial:   less than 140 mg/dL      Peak postprandial:   less than 180 mg/dL (1-2 hours)      Critically ill patients:  140 - 180 mg/dL     Results for DEMARRI, ELIE (MRN [id number]) as of 06/04/2014 08:56  Ref. Range 06/03/2014 07:39 06/03/2014 15:32 06/03/2014 18:57 06/03/2014 21:10  Glucose-Capillary Latest Range: 70-99 mg/dL 255 (H) 258 (H) 291 (H) 253 (H)    Results for IRINEO, GAULIN (MRN [id number]) as of 06/04/2014 08:56  Ref. Range 06/04/2014 07:51  Glucose-Capillary Latest Range: 70-99 mg/dL 265 (H)     Home DM Meds: Amaryl 4 mg bid       Januvia 100 mg daily   Current Insulin Orders: Lantus 10 units QHS      Novolog Resistant SSI tidwc   **Note patient received 4 mg Decadron yesterday at 12:30pm.    **Note 1st dose of Lantus given last PM.  **Patient to start Solid PO diet today.  Fasting glucose still elevated this AM.    MD- Please consider increasing Lantus to 15 units QHS (~0.2units/kg dosing)    Will follow Wyn Quaker RN, MSN, CDE Diabetes Coordinator Inpatient Diabetes Program Team Pager: [phone] (8a-10p)
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
## 2014-06-04 NOTE — Progress Notes (Signed)
Report called to RN on 5N, no s/s of acute distress noted. Will transfer pt via bed, daughter at bedside.

## 2014-06-04 NOTE — Progress Notes (Signed)
Rehab Admissions Coordinator Note:  Patient was screened by Retta Diones for appropriateness for an Inpatient Acute Rehab Consult.  At this time, we are recommending Inpatient Rehab consult.  Retta Diones 06/04/2014, 2:30 PM  I can be reached at [phone].

## 2014-06-04 NOTE — Progress Notes (Signed)
Attempted to call report. Awaiting call back.
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
## 2014-06-04 NOTE — Progress Notes (Signed)
Subjective: 1 Day Post-Op Procedure(s) (LRB): OPEN REDUCTION INTERNAL FIXATION (ORIF) SHOULDER FRACTURE (Right) Patient reports pain as moderate. Some tingling and slight decreased sensation right upper extremity.  Objective: Vital signs in last 24 hours: Temp:  [98 F (36.7 C)-99.3 F (37.4 C)] 98.7 F (37.1 C) (12/14 0700) Pulse Rate:  [49-113] 69 (12/14 0707) Resp:  [11-23] 17 (12/14 0707) BP: (97-151)/(50-89) 131/74 mmHg (12/14 0707) SpO2:  [94 %-100 %] 97 % (12/14 0707)  Intake/Output from previous day: 12/13 0701 - 12/14 0700 In: 4355 [P.O.:180; I.V.:4125; IV Piggyback:50] Out: 1000 [Urine:900; Blood:100] Intake/Output this shift: Total I/O In: -  Out: 200 [Urine:200]   Recent Labs  06/01/14 2016 06/02/14 0315 06/03/14 0315 06/04/14 0705  HGB 15.5 8.6* 10.0* 10.1*    Recent Labs  06/03/14 0315 06/04/14 0705  WBC 8.7 8.1  RBC 3.34* 3.28*  HCT 30.9* 30.6*  PLT 135* 137*    Recent Labs  06/03/14 0315 06/03/14 1254 06/04/14 0705  NA 136*  --  140  K 4.0  --  4.8  CL 102  --  107  CO2 23  --  21  BUN 37*  --  34*  CREATININE 1.66*  --  1.32  GLUCOSE 269* 189* 260*  CALCIUM 8.8  --  8.4    Recent Labs  06/01/14 2016  INR 1.30    Neurologically intact ABD soft Neurovascular intact Sensation intact distally Intact pulses distally Compartment soft Patient can move all fingers and has normal sensation.  Globally he feels decreased overall sensation which is expected in the right upper extremity. Assessment/Plan: 1 Day Post-Op Procedure(s) (LRB): OPEN REDUCTION INTERNAL FIXATION (ORIF) SHOULDER FRACTURE (Right) Advance diet Up with therapy  We'll get OT consult to work on edema control the right upper extremity and begin slow passive motion.  Delsie Amador L 06/04/2014, 8:37 AM

## 2014-06-05 DIAGNOSIS — E1140 Type 2 diabetes mellitus with diabetic neuropathy, unspecified: Secondary | ICD-10-CM

## 2014-06-05 DIAGNOSIS — S42201S Unspecified fracture of upper end of right humerus, sequela: Secondary | ICD-10-CM

## 2014-06-05 LAB — GLUCOSE, CAPILLARY
GLUCOSE-CAPILLARY: 241 mg/dL — AB (ref 70–99)
Glucose-Capillary: 182 mg/dL — ABNORMAL HIGH (ref 70–99)
Glucose-Capillary: 226 mg/dL — ABNORMAL HIGH (ref 70–99)
Glucose-Capillary: 195 mg/dL — ABNORMAL HIGH (ref 70–99)

## 2014-06-05 MED ORDER — SODIUM CHLORIDE 0.9 % IJ SOLN: 3.0000 mL | INTRAMUSCULAR | Status: DC | PRN

## 2014-06-05 MED ORDER — SODIUM CHLORIDE 0.9 % IJ SOLN
3.0000 mL | Freq: Two times a day (BID) | INTRAMUSCULAR | Status: DC
  Administered 2014-06-06 – 2014-06-12 (×5): 3 mL via INTRAVENOUS

## 2014-06-05 MED ORDER — SODIUM CHLORIDE 0.9 % IV SOLN
250.0000 mL | INTRAVENOUS | Status: DC | PRN
  Administered 2014-06-10: 250 mL via INTRAVENOUS

## 2014-06-05 MED ORDER — INSULIN GLARGINE 100 UNIT/ML ~~LOC~~ SOLN
20.0000 [IU] | Freq: Every day | SUBCUTANEOUS | Status: DC
  Administered 2014-06-05: 20 [IU] via SUBCUTANEOUS
  Filled 2014-06-05 (×2): qty 0.2

## 2014-06-05 MED ORDER — TRAMADOL HCL 50 MG PO TABS
50.0000 mg | ORAL_TABLET | Freq: Four times a day (QID) | ORAL | Status: DC
  Administered 2014-06-05 – 2014-06-07 (×8): 50 mg via ORAL
  Filled 2014-06-05 (×8): qty 1

## 2014-06-05 NOTE — Progress Notes (Signed)
Patient ID: Gabriel Riley, male   DOB: Nov 11, 1940, 73 y.o.   MRN: [id number] Patient has been resting comfortably in bed all day. His x-ray of the neck yesterday reveals only down the C6 does not show the C6-C7 disc space very well. Neurologically remains intact. His been started on Lyrica. I would advise that we allow him to mobilize and see how his left upper extremity does. There is some disc disruption at C6-C7 but the canal appears patent. If things are not tolerable for him to level anterior decompression arthrodesis may become necessary.
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
## 2014-06-05 NOTE — Progress Notes (Signed)
PT Cancellation Note  Patient Details Name: Gabriel Riley MRN: [id number] DOB: [birth year]   Cancelled Treatment:    Reason Eval/Treat Not Completed: Other (comment).  Per family request we are holding all mobility (even bed mobility) until neurosurgeon has cleared pt.  X-rays taken yesterday.  No neurosurgery note yet today.  PT will continue to follow acutely and progress as clearance and pain allows.  We will need to make sure this pt is pre medicated as he could not even get his shoulders off of the bed surface last session.  I discussed that if we need to we can start with some bed level exercises, but that our main goal is progressive mobility from a PT standpoint.  His son verbalized understanding that this could be a painful process.  Thanks,    Barbarann Ehlers. Jamine Wingate, [place], DPT [phone]   06/05/2014, 1:53 PM

## 2014-06-05 NOTE — Progress Notes (Signed)
UR completed.  Garreth Burnsworth, RN BSN MHA CCM Trauma/Neuro ICU Case Manager [phone]  
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
## 2014-06-05 NOTE — Progress Notes (Signed)
Rehab admissions - I met with pt and his son/dtr in follow up to rehab MD consult to explain the possibility of inpatient rehab. Further questions were answered and informational brochures were given. Clarification was given to the differences between rehab at a skilled nursing level vs rehab at [facility]. They are interested in pursuing inpatient rehab.   Both son/dtr do work and we discussed that rehab MD has recommended that supervision be provided at the end of a possible inpatient rehab stay. Pt/son/dtr shared that they would work together to provide the needed supervision and that son works from home. In addition, they stated they may look into hiring private caregivers during the day if needed.  Pt has [employer] and I will now open the case to seek insurance authorization. We will consider possible inpatient rehab admit pending his medical clearance, insurance authorization and our bed availability.   I will share an update tomorrow with pt/family/medical team. Please call me with any questions. Thanks.   Nanetta Batty, PT Rehabilitation Admissions Coordinator [phone]

## 2014-06-05 NOTE — Progress Notes (Signed)
Subjective: 2 Days Post-Op Procedure(s) (LRB): OPEN REDUCTION INTERNAL FIXATION (ORIF) SHOULDER FRACTURE (Right) Patient reports pain as mild.    Objective: Vital signs in last 24 hours: Temp:  [98.8 F (37.1 C)-98.9 F (37.2 C)] 98.8 F (37.1 C) (12/15 0557) Pulse Rate:  [78-80] 78 (12/15 0557) Resp:  [18] 18 (12/15 0557) BP: (135-141)/(70-74) 136/72 mmHg (12/15 0557) SpO2:  [88 %-97 %] 96 % (12/15 0557)  Intake/Output from previous day: 12/14 0701 - 12/15 0700 In: 120 [P.O.:120] Out: 575 [Urine:575] Intake/Output this shift: Total I/O In: 240 [P.O.:240] Out: 175 [Urine:175]   Recent Labs  06/03/14 0315 06/04/14 0705  HGB 10.0* 10.1*    Recent Labs  06/03/14 0315 06/04/14 0705  WBC 8.7 8.1  RBC 3.34* 3.28*  HCT 30.9* 30.6*  PLT 135* 137*    Recent Labs  06/03/14 0315 06/03/14 1254 06/04/14 0705  NA 136*  --  140  K 4.0  --  4.8  CL 102  --  107  CO2 23  --  21  BUN 37*  --  34*  CREATININE 1.66*  --  1.32  GLUCOSE 269* 189* 260*  CALCIUM 8.8  --  8.4   No results for input(s): LABPT, INR in the last 72 hours. Right shoulder exam: Dressing benign.  Some swelling in hand.  Wrapped with Ace wrap.normal sensation in fingers. Shoulder immobilizer intact.  Assessment/Plan: 2 Days Post-Op Procedure(s) (LRB): OPEN REDUCTION INTERNAL FIXATION (ORIF) SHOULDER FRACTURE (Right)  Plan: May remove the sling only for elbow range of motion and very gentle circumduction exercises. We'll follow while in hospital. Will need followup with Dr. Berenice Primas in 2 weeks.   Gabriel Riley 06/05/2014, 10:23 AM
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
## 2014-06-06 DIAGNOSIS — K5909 Other constipation: Secondary | ICD-10-CM

## 2014-06-06 DIAGNOSIS — E119 Type 2 diabetes mellitus without complications: Secondary | ICD-10-CM

## 2014-06-06 DIAGNOSIS — K5900 Constipation, unspecified: Secondary | ICD-10-CM | POA: Diagnosis present

## 2014-06-06 LAB — GLUCOSE, CAPILLARY
GLUCOSE-CAPILLARY: 191 mg/dL — AB (ref 70–99)
Glucose-Capillary: 178 mg/dL — ABNORMAL HIGH (ref 70–99)
Glucose-Capillary: 247 mg/dL — ABNORMAL HIGH (ref 70–99)
Glucose-Capillary: 233 mg/dL — ABNORMAL HIGH (ref 70–99)

## 2014-06-06 MED ORDER — SENNOSIDES-DOCUSATE SODIUM 8.6-50 MG PO TABS
2.0000 | ORAL_TABLET | Freq: Every day | ORAL | Status: DC
  Administered 2014-06-06 – 2014-06-10 (×5): 2 via ORAL
  Filled 2014-06-06 (×5): qty 2

## 2014-06-06 MED ORDER — INSULIN GLARGINE 100 UNIT/ML ~~LOC~~ SOLN
5.0000 [IU] | Freq: Every day | SUBCUTANEOUS | Status: DC
  Administered 2014-06-06 – 2014-06-11 (×6): 5 [IU] via SUBCUTANEOUS
  Filled 2014-06-06 (×7): qty 0.05

## 2014-06-06 MED ORDER — GLIMEPIRIDE 4 MG PO TABS
4.0000 mg | ORAL_TABLET | Freq: Two times a day (BID) | ORAL | Status: DC
  Administered 2014-06-06 – 2014-06-12 (×10): 4 mg via ORAL
  Filled 2014-06-06 (×13): qty 1

## 2014-06-06 MED ORDER — LINAGLIPTIN 5 MG PO TABS
5.0000 mg | ORAL_TABLET | Freq: Every day | ORAL | Status: DC
  Administered 2014-06-06 – 2014-06-12 (×6): 5 mg via ORAL
  Filled 2014-06-06 (×7): qty 1
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
## 2014-06-06 NOTE — Progress Notes (Signed)
Rehab admissions - I am following pt's case and clarified his insurance benefits. Pt has Medicare A & B as primary Chartered loss adjuster as secondary. EPIC system indicated that pt only had BCBS policy. Thus, we will not need to obtain insurance authorization for a possible inpatient rehab stay. I updated pt and his dtr and pt verified that his Medicare is primary, BCBS secondary and that he did not mention this yesterday to me. I will also update the financial team of this insurance information.  Pt and his dtr shared that pt has been having significant pain with repositioning in the bed. Dtr states, "We still don't really have the answers of why he's having the pain and what exactly is going on with his neck."  I reiterated that pt will need to show an increased activity tolerance as our rehab program will entail 3 hrs of activity daily with pt needing to tolerate being up out of bed and participating in therapies. Pt/dtr understood this and stated that pt could not tolerate that intensity at this time.  Today, we are limited on available inpatient rehab beds and likely will have no open beds tomorrow either. I discussed this with pt/family and shared that pursuing skilled nursing may be the next option if we have no available beds when pt is medically ready for inpatient rehab.   I will check on pt's status tomorrow and see how he progresses with therapy today. I spoke with OT about pt's case as well and the need to increase pt's overall activity tolerance.  Please call me with any questions. Thanks.  Nanetta Batty, PT Rehabilitation Admissions Coordinator [phone]

## 2014-06-06 NOTE — Progress Notes (Signed)
Patient ID: ADI DORO, male   DOB: 06/19/1941, 73 y.o.   MRN: [id number] Patient did poorly with pain control in both arms but particularly in left today with mobilization.  Discussed situation with patient's family indicating concerns over disc disruption at C6-C7 aggravating patient significant pain. While at rest in the collar he seems to be doing fine however with the stress of mobilization it seems to aggravate significant left cervical radiculopathy in addition to some likely right cervical radiculopathy.  I discussed to level anterior decompression arthrodesis at C5-6 and at C6-C7 with the patient and his family and they are in agreement that it is time to proceed with some more definitive treatment. Issue now involves scheduling this procedure. We will work to schedule at his earliest possible.
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
## 2014-06-06 NOTE — Progress Notes (Signed)
Subjective: 3 Days Post-Op Procedure(s) (LRB): OPEN REDUCTION INTERNAL FIXATION (ORIF) SHOULDER FRACTURE (Right) Patient reports pain as moderate.  Minimal right shoulder pain unless he moves about.  Objective: Vital signs in last 24 hours: Temp:  [97.8 F (36.6 C)-98.4 F (36.9 C)] 97.8 F (36.6 C) (12/16 0456) Pulse Rate:  [66-80] 66 (12/16 0456) Resp:  [17-20] 17 (12/16 0456) BP: (114-147)/(68-81) 147/81 mmHg (12/16 0456) SpO2:  [91 %-98 %] 98 % (12/16 0456)  Intake/Output from previous day: 12/15 0701 - 12/16 0700 In: 1677.7 [P.O.:1080; I.V.:597.7] Out: 875 [Urine:875] Intake/Output this shift:     Recent Labs  06/04/14 0705  HGB 10.1*    Recent Labs  06/04/14 0705  WBC 8.1  RBC 3.28*  HCT 30.6*  PLT 137*    Recent Labs  06/03/14 1254 06/04/14 0705  NA  --  140  K  --  4.8  CL  --  107  CO2  --  21  BUN  --  34*  CREATININE  --  1.32  GLUCOSE 189* 260*  CALCIUM  --  8.4   No results for input(s): LABPT, INR in the last 72 hours. Right shoulder exam: Dressing clean and dry. In the intact distally out to the fingers. He does have some moderate finger swelling, but moves them well.   Assessment/Plan: 3 Days Post-Op Procedure(s) (LRB): OPEN REDUCTION INTERNAL FIXATION (ORIF) SHOULDER FRACTURE (Right) Plan: Continue sling on right side. Encouraged hand/finger/wrist range of motion. Will follow. When discharge will need follow-up with Dr. Berenice Primas in approximately 2 weeks. We will be out of  the office between Christmas and New Year's. If he is discharged in the next day or so we can see him before Christmas.   Harper Vandervoort G 06/06/2014, 8:36 AM
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
## 2014-06-06 NOTE — Progress Notes (Signed)
06/06/2014 Charges for PT session: 3 TA  Coston Mandato B. Smyer, [place], DPT [phone]

## 2014-06-07 DIAGNOSIS — S134XXA Sprain of ligaments of cervical spine, initial encounter: Secondary | ICD-10-CM | POA: Diagnosis present

## 2014-06-07 DIAGNOSIS — S42301A Unspecified fracture of shaft of humerus, right arm, initial encounter for closed fracture: Secondary | ICD-10-CM | POA: Diagnosis present

## 2014-06-07 LAB — GLUCOSE, CAPILLARY
GLUCOSE-CAPILLARY: 144 mg/dL — AB (ref 70–99)
GLUCOSE-CAPILLARY: 161 mg/dL — AB (ref 70–99)
Glucose-Capillary: 134 mg/dL — ABNORMAL HIGH (ref 70–99)

## 2014-06-07 MED ORDER — HYDROCODONE-ACETAMINOPHEN 10-325 MG PO TABS
0.5000 | ORAL_TABLET | ORAL | Status: DC | PRN
  Administered 2014-06-07 – 2014-06-08 (×5): 2 via ORAL
  Filled 2014-06-07 (×7): qty 2

## 2014-06-07 MED ORDER — NAPROXEN 250 MG PO TABS
500.0000 mg | ORAL_TABLET | Freq: Two times a day (BID) | ORAL | Status: DC
  Administered 2014-06-08 (×2): 500 mg via ORAL
  Filled 2014-06-07: qty 1
  Filled 2014-06-07 (×5): qty 2
  Filled 2014-06-07: qty 1

## 2014-06-07 MED ORDER — TRAMADOL HCL 50 MG PO TABS
100.0000 mg | ORAL_TABLET | Freq: Four times a day (QID) | ORAL | Status: DC
  Administered 2014-06-07 – 2014-06-09 (×7): 100 mg via ORAL
  Administered 2014-06-09: 50 mg via ORAL
  Administered 2014-06-09 – 2014-06-12 (×8): 100 mg via ORAL
  Filled 2014-06-07 (×17): qty 2

## 2014-06-07 MED ORDER — MORPHINE SULFATE 2 MG/ML IJ SOLN
2.0000 mg | INTRAMUSCULAR | Status: DC | PRN
  Administered 2014-06-07 – 2014-06-08 (×6): 2 mg via INTRAVENOUS
  Filled 2014-06-07 (×7): qty 1

## 2014-06-07 MED ORDER — ENOXAPARIN SODIUM 40 MG/0.4ML ~~LOC~~ SOLN
40.0000 mg | SUBCUTANEOUS | Status: DC
  Administered 2014-06-07 – 2014-06-08 (×2): 40 mg via SUBCUTANEOUS
  Filled 2014-06-07 (×3): qty 0.4

## 2014-06-07 NOTE — Progress Notes (Signed)
Rehab admissions - I am following pt's case and made note of Dr. Clarice Pole latest note and that ACDF surgery is now being recommended. Per Dr. Ellene Route on 06-07-14:   Patient did poorly with pain control in both arms but particularly in left today with mobilization.  Discussed situation with patient's family indicating concerns over disc disruption at C6-C7 aggravating patient significant pain. While at rest in the collar he seems to be doing fine however with the stress of mobilization it seems to aggravate significant left cervical radiculopathy in addition to some likely right cervical radiculopathy.  I discussed to level anterior decompression arthrodesis at C5-6 and at C6-C7 with the patient and his family and they are in agreement that it is time to proceed with some more definitive treatment. Issue now involves scheduling this procedure. We will work to schedule at his earliest possible.     I will continue to follow pt and monitor pt's progress following this surgery. I met with pt and his daughter to share that we will follow pt's case and watch his progress with therapies following ACDF. Support was provided.  Please call me with any questions. Thanks.  Nanetta Batty, PT Rehabilitation Admissions Coordinator [phone]
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
## 2014-06-07 NOTE — Progress Notes (Signed)
PT Cancellation/Discharge Note  Patient Details Name: Gabriel Riley MRN: [id number] DOB: 05-Apr-1941   Cancelled Treatment:    Reason Eval/Treat Not Completed: Other (comment) (on bedrest until ACDF surgery ).  PT will await post-op orders to resume therapy.  Signing-off for now.   Thanks,   Barbarann Ehlers. Gianluca Chhim, PT, DPT [phone]   06/07/2014, 11:48 AM

## 2014-06-08 LAB — GLUCOSE, CAPILLARY
Glucose-Capillary: 126 mg/dL — ABNORMAL HIGH (ref 70–99)
Glucose-Capillary: 130 mg/dL — ABNORMAL HIGH (ref 70–99)
Glucose-Capillary: 155 mg/dL — ABNORMAL HIGH (ref 70–99)
Glucose-Capillary: 139 mg/dL — ABNORMAL HIGH (ref 70–99)

## 2014-06-08 MED ORDER — POLYETHYLENE GLYCOL 3350 17 G PO PACK
17.0000 g | PACK | Freq: Every day | ORAL | Status: DC
  Administered 2014-06-08 – 2014-06-09 (×2): 17 g via ORAL
  Filled 2014-06-08 (×5): qty 1

## 2014-06-08 MED ORDER — MORPHINE SULFATE ER 15 MG PO TBCR
15.0000 mg | EXTENDED_RELEASE_TABLET | Freq: Two times a day (BID) | ORAL | Status: DC
  Administered 2014-06-08 – 2014-06-12 (×4): 15 mg via ORAL
  Filled 2014-06-08 (×4): qty 1

## 2014-06-08 MED ORDER — MORPHINE SULFATE 2 MG/ML IJ SOLN
2.0000 mg | Freq: Once | INTRAMUSCULAR | Status: AC
Start: 2014-06-08 — End: 2014-06-08
  Administered 2014-06-08: 2 mg via INTRAVENOUS

## 2014-06-08 NOTE — Progress Notes (Signed)
bedrest
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
## 2014-06-08 NOTE — Progress Notes (Signed)
Scheduled MS Contin

## 2014-06-08 NOTE — Progress Notes (Signed)
Rehab admissions - I am following pt's case and made note that further surgery is planned. Per latest trauma PA note, Will try to contact the neurosurgeon to ask about timing of surgery.  Pain in the left arm is worse with movement.  Currently doing okay at bedrest."  I will continue to follow pt's status and we will consider possible inpatient rehab admit when all medical procedures are completed and pt is participating with therapies.  Please call me with any questions. Thanks.  Nanetta Batty, PT Rehabilitation Admissions Coordinator [phone]
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
## 2014-06-08 NOTE — Progress Notes (Signed)
Non weight bearing right arm, generalized weakness
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
## 2014-06-08 NOTE — Progress Notes (Signed)
scheduled ultram

## 2014-06-09 ENCOUNTER — Encounter (HOSPITAL_COMMUNITY): Admission: EM | Disposition: A | Discharge status: Rehabilitation Facility | Payer: Self-pay | Source: Home / Self Care

## 2014-06-09 ENCOUNTER — Inpatient Hospital Stay (HOSPITAL_COMMUNITY): Payer: Medicare Other | Admitting: Certified Registered Nurse Anesthetist

## 2014-06-09 ENCOUNTER — Inpatient Hospital Stay (HOSPITAL_COMMUNITY): Payer: Medicare Other

## 2014-06-09 ENCOUNTER — Encounter (HOSPITAL_COMMUNITY): Payer: Self-pay | Admitting: Certified Registered Nurse Anesthetist

## 2014-06-09 HISTORY — PX: ANTERIOR CERVICAL DECOMP/DISCECTOMY FUSION: SHX1161

## 2014-06-09 LAB — BASIC METABOLIC PANEL
Anion gap: 8 (ref 5–15)
BUN: 21 mg/dL (ref 6–23)
CHLORIDE: 99 meq/L (ref 96–112)
CO2: 28 meq/L (ref 19–32)
CREATININE: 1.01 mg/dL (ref 0.50–1.35)
Calcium: 8.9 mg/dL (ref 8.4–10.5)
GFR calc Af Amer: 84 mL/min — ABNORMAL LOW (ref >90)
GFR calc non Af Amer: 72 mL/min — ABNORMAL LOW (ref >90)
GLUCOSE: 104 mg/dL — AB (ref 70–99)
POTASSIUM: 4.1 meq/L (ref 3.7–5.3)
Sodium: 135 mEq/L — ABNORMAL LOW (ref 137–147)

## 2014-06-09 LAB — TYPE AND SCREEN
ABO/RH(D): A POS
Antibody Screen: NEGATIVE

## 2014-06-09 LAB — CBC
HEMATOCRIT: 28.9 % — AB (ref 39.0–52.0)
HEMOGLOBIN: 9.7 g/dL — AB (ref 13.0–17.0)
MCH: 31 pg (ref 26.0–34.0)
MCHC: 33.6 g/dL (ref 30.0–36.0)
MCV: 92.3 fL (ref 78.0–100.0)
Platelets: 217 10*3/uL (ref 150–400)
RBC: 3.13 MIL/uL — AB (ref 4.22–5.81)
RDW: 14.1 % (ref 11.5–15.5)
WBC: 7.6 10*3/uL (ref 4.0–10.5)

## 2014-06-09 LAB — GLUCOSE, CAPILLARY
GLUCOSE-CAPILLARY: 98 mg/dL (ref 70–99)
GLUCOSE-CAPILLARY: 109 mg/dL — AB (ref 70–99)
Glucose-Capillary: 164 mg/dL — ABNORMAL HIGH (ref 70–99)
Glucose-Capillary: 112 mg/dL — ABNORMAL HIGH (ref 70–99)
Glucose-Capillary: 193 mg/dL — ABNORMAL HIGH (ref 70–99)

## 2014-06-09 LAB — ABO/RH: ABO/RH(D): A POS

## 2014-06-09 SURGERY — ANTERIOR CERVICAL DECOMPRESSION/DISCECTOMY FUSION 2 LEVEL/HARDWARE REMOVAL
Anesthesia: General

## 2014-06-09 SURGERY — ANTERIOR CERVICAL DECOMPRESSION/DISCECTOMY FUSION 2 LEVELS
Anesthesia: General

## 2014-06-09 MED ORDER — PROPOFOL 10 MG/ML IV BOLUS
INTRAVENOUS | Status: DC | PRN
  Administered 2014-06-09: 120 mg via INTRAVENOUS

## 2014-06-09 MED ORDER — ARTIFICIAL TEARS OP OINT
TOPICAL_OINTMENT | OPHTHALMIC | Status: AC
  Filled 2014-06-09: qty 3.5

## 2014-06-09 MED ORDER — LIDOCAINE-EPINEPHRINE 1 %-1:100000 IJ SOLN
INTRAMUSCULAR | Status: DC | PRN
  Administered 2014-06-09: 6 mL

## 2014-06-09 MED ORDER — HYDROMORPHONE HCL 1 MG/ML IJ SOLN: 0.2500 mg | INTRAMUSCULAR | Status: DC | PRN

## 2014-06-09 MED ORDER — PHENOL 1.4 % MT LIQD: 1.0000 | OROMUCOSAL | Status: DC | PRN

## 2014-06-09 MED ORDER — ROCURONIUM BROMIDE 100 MG/10ML IV SOLN
INTRAVENOUS | Status: DC | PRN
  Administered 2014-06-09: 5 mg via INTRAVENOUS
  Administered 2014-06-09: 30 mg via INTRAVENOUS

## 2014-06-09 MED ORDER — ONDANSETRON HCL 4 MG/2ML IJ SOLN
INTRAMUSCULAR | Status: AC
  Filled 2014-06-09: qty 2

## 2014-06-09 MED ORDER — EPHEDRINE SULFATE 50 MG/ML IJ SOLN
INTRAMUSCULAR | Status: AC
  Filled 2014-06-09: qty 1

## 2014-06-09 MED ORDER — ONDANSETRON HCL 4 MG/2ML IJ SOLN
INTRAMUSCULAR | Status: DC | PRN
  Administered 2014-06-09: 4 mg via INTRAVENOUS

## 2014-06-09 MED ORDER — POLYETHYLENE GLYCOL 3350 17 G PO PACK: 17.0000 g | PACK | Freq: Every day | ORAL | Status: DC | PRN

## 2014-06-09 MED ORDER — FENTANYL CITRATE 0.05 MG/ML IJ SOLN
INTRAMUSCULAR | Status: AC
  Filled 2014-06-09: qty 5

## 2014-06-09 MED ORDER — SODIUM CHLORIDE 0.9 % IJ SOLN
INTRAMUSCULAR | Status: AC
  Filled 2014-06-09: qty 10

## 2014-06-09 MED ORDER — CEFAZOLIN SODIUM-DEXTROSE 2-3 GM-% IV SOLR
INTRAVENOUS | Status: DC | PRN
  Administered 2014-06-09: 2 g via INTRAVENOUS

## 2014-06-09 MED ORDER — HEMOSTATIC AGENTS (NO CHARGE) OPTIME
TOPICAL | Status: DC | PRN
  Administered 2014-06-09: 1 via TOPICAL

## 2014-06-09 MED ORDER — BUPIVACAINE HCL (PF) 0.5 % IJ SOLN
INTRAMUSCULAR | Status: DC | PRN
  Administered 2014-06-09: 6 mL

## 2014-06-09 MED ORDER — CEFAZOLIN SODIUM 1-5 GM-% IV SOLN
1.0000 g | Freq: Three times a day (TID) | INTRAVENOUS | Status: AC
  Administered 2014-06-09 – 2014-06-10 (×2): 1 g via INTRAVENOUS
  Filled 2014-06-09 (×2): qty 50

## 2014-06-09 MED ORDER — OXYCODONE HCL 5 MG PO TABS: 5.0000 mg | ORAL_TABLET | Freq: Once | ORAL | Status: DC | PRN

## 2014-06-09 MED ORDER — ACETAMINOPHEN 325 MG PO TABS: 650.0000 mg | ORAL_TABLET | ORAL | Status: DC | PRN

## 2014-06-09 MED ORDER — LIDOCAINE HCL (CARDIAC) 20 MG/ML IV SOLN
INTRAVENOUS | Status: DC | PRN
  Administered 2014-06-09: 80 mg via INTRAVENOUS

## 2014-06-09 MED ORDER — ARTIFICIAL TEARS OP OINT
TOPICAL_OINTMENT | OPHTHALMIC | Status: DC | PRN
  Administered 2014-06-09: 1 via OPHTHALMIC

## 2014-06-09 MED ORDER — PROPOFOL 10 MG/ML IV BOLUS
INTRAVENOUS | Status: AC
  Filled 2014-06-09: qty 20

## 2014-06-09 MED ORDER — ROCURONIUM BROMIDE 50 MG/5ML IV SOLN
INTRAVENOUS | Status: AC
  Filled 2014-06-09: qty 1

## 2014-06-09 MED ORDER — NEOSTIGMINE METHYLSULFATE 10 MG/10ML IV SOLN
INTRAVENOUS | Status: AC
  Filled 2014-06-09: qty 1

## 2014-06-09 MED ORDER — LACTATED RINGERS IV SOLN
INTRAVENOUS | Status: DC | PRN
  Administered 2014-06-09: 10:00:00 via INTRAVENOUS

## 2014-06-09 MED ORDER — 0.9 % SODIUM CHLORIDE (POUR BTL) OPTIME
TOPICAL | Status: DC | PRN
  Administered 2014-06-09: 1000 mL

## 2014-06-09 MED ORDER — SODIUM CHLORIDE 0.9 % IV SOLN: 250.0000 mL | INTRAVENOUS | Status: DC

## 2014-06-09 MED ORDER — ACETAMINOPHEN 650 MG RE SUPP: 650.0000 mg | RECTAL | Status: DC | PRN

## 2014-06-09 MED ORDER — ALUM & MAG HYDROXIDE-SIMETH 200-200-20 MG/5ML PO SUSP: 30.0000 mL | Freq: Four times a day (QID) | ORAL | Status: DC | PRN

## 2014-06-09 MED ORDER — OXYCODONE HCL 5 MG/5ML PO SOLN: 5.0000 mg | Freq: Once | ORAL | Status: DC | PRN

## 2014-06-09 MED ORDER — FENTANYL CITRATE 0.05 MG/ML IJ SOLN
INTRAMUSCULAR | Status: DC | PRN
  Administered 2014-06-09: 25 ug via INTRAVENOUS
  Administered 2014-06-09: 100 ug via INTRAVENOUS
  Administered 2014-06-09 (×3): 25 ug via INTRAVENOUS

## 2014-06-09 MED ORDER — DIAZEPAM 5 MG PO TABS: 5.0000 mg | ORAL_TABLET | Freq: Four times a day (QID) | ORAL | Status: DC | PRN

## 2014-06-09 MED ORDER — THROMBIN 5000 UNITS EX SOLR
CUTANEOUS | Status: DC | PRN
  Administered 2014-06-09 (×2): 5000 [IU] via TOPICAL

## 2014-06-09 MED ORDER — HYDROCODONE-ACETAMINOPHEN 5-325 MG PO TABS
1.0000 | ORAL_TABLET | ORAL | Status: DC | PRN
  Administered 2014-06-11: 1 via ORAL
  Filled 2014-06-09: qty 1

## 2014-06-09 MED ORDER — SUCCINYLCHOLINE CHLORIDE 20 MG/ML IJ SOLN
INTRAMUSCULAR | Status: AC
  Filled 2014-06-09: qty 1

## 2014-06-09 MED ORDER — SODIUM CHLORIDE 0.9 % IJ SOLN: 3.0000 mL | INTRAMUSCULAR | Status: DC | PRN

## 2014-06-09 MED ORDER — NEOSTIGMINE METHYLSULFATE 10 MG/10ML IV SOLN
INTRAVENOUS | Status: DC | PRN
  Administered 2014-06-09: 3 mg via INTRAVENOUS

## 2014-06-09 MED ORDER — ONDANSETRON HCL 4 MG/2ML IJ SOLN: 4.0000 mg | INTRAMUSCULAR | Status: DC | PRN

## 2014-06-09 MED ORDER — LIDOCAINE HCL (CARDIAC) 20 MG/ML IV SOLN
INTRAVENOUS | Status: AC
Start: 2014-06-09 — End: 2014-06-09
  Filled 2014-06-09: qty 5

## 2014-06-09 MED ORDER — DOCUSATE SODIUM 100 MG PO CAPS
100.0000 mg | ORAL_CAPSULE | Freq: Two times a day (BID) | ORAL | Status: DC
  Administered 2014-06-09 – 2014-06-12 (×5): 100 mg via ORAL
  Filled 2014-06-09 (×7): qty 1

## 2014-06-09 MED ORDER — GLYCOPYRROLATE 0.2 MG/ML IJ SOLN
INTRAMUSCULAR | Status: AC
  Filled 2014-06-09: qty 2

## 2014-06-09 MED ORDER — DEXAMETHASONE SODIUM PHOSPHATE 10 MG/ML IJ SOLN
INTRAMUSCULAR | Status: DC | PRN
  Administered 2014-06-09: 10 mg via INTRAVENOUS

## 2014-06-09 MED ORDER — OXYCODONE-ACETAMINOPHEN 5-325 MG PO TABS: 1.0000 | ORAL_TABLET | ORAL | Status: DC | PRN

## 2014-06-09 MED ORDER — MIDAZOLAM HCL 2 MG/2ML IJ SOLN
INTRAMUSCULAR | Status: AC
  Filled 2014-06-09: qty 2

## 2014-06-09 MED ORDER — BISACODYL 10 MG RE SUPP
10.0000 mg | Freq: Every day | RECTAL | Status: DC | PRN
  Administered 2014-06-10: 10 mg via RECTAL
  Filled 2014-06-09: qty 1

## 2014-06-09 MED ORDER — PHENYLEPHRINE HCL 10 MG/ML IJ SOLN
INTRAMUSCULAR | Status: DC | PRN
  Administered 2014-06-09: 80 ug via INTRAVENOUS

## 2014-06-09 MED ORDER — PROMETHAZINE HCL 25 MG/ML IJ SOLN: 6.2500 mg | INTRAMUSCULAR | Status: DC | PRN

## 2014-06-09 MED ORDER — GLYCOPYRROLATE 0.2 MG/ML IJ SOLN
INTRAMUSCULAR | Status: DC | PRN
  Administered 2014-06-09: .4 mg via INTRAVENOUS

## 2014-06-09 MED ORDER — PHENYLEPHRINE HCL 10 MG/ML IJ SOLN
10.0000 mg | INTRAVENOUS | Status: DC | PRN
  Administered 2014-06-09: 5 ug/min via INTRAVENOUS

## 2014-06-09 MED ORDER — PHENYLEPHRINE 40 MCG/ML (10ML) SYRINGE FOR IV PUSH (FOR BLOOD PRESSURE SUPPORT)
PREFILLED_SYRINGE | INTRAVENOUS | Status: AC
  Filled 2014-06-09: qty 10

## 2014-06-09 MED ORDER — SODIUM CHLORIDE 0.9 % IV SOLN
INTRAVENOUS | Status: DC | PRN
  Administered 2014-06-09: 12:00:00 via INTRAVENOUS

## 2014-06-09 MED ORDER — DEXAMETHASONE SODIUM PHOSPHATE 10 MG/ML IJ SOLN
INTRAMUSCULAR | Status: AC
  Filled 2014-06-09: qty 1

## 2014-06-09 MED ORDER — SODIUM CHLORIDE 0.9 % IJ SOLN
3.0000 mL | Freq: Two times a day (BID) | INTRAMUSCULAR | Status: DC
  Administered 2014-06-10 – 2014-06-11 (×2): 3 mL via INTRAVENOUS

## 2014-06-09 MED ORDER — SUCCINYLCHOLINE CHLORIDE 20 MG/ML IJ SOLN
INTRAMUSCULAR | Status: DC | PRN
  Administered 2014-06-09: 120 mg via INTRAVENOUS

## 2014-06-09 MED ORDER — SENNA 8.6 MG PO TABS
1.0000 | ORAL_TABLET | Freq: Two times a day (BID) | ORAL | Status: DC
  Administered 2014-06-10 – 2014-06-12 (×3): 8.6 mg via ORAL
  Filled 2014-06-09 (×7): qty 1

## 2014-06-09 MED ORDER — MENTHOL 3 MG MT LOZG: 1.0000 | LOZENGE | OROMUCOSAL | Status: DC | PRN

## 2014-06-09 SURGICAL SUPPLY — 37 items
ALLOGRAFT LORDOTIC CC 7X11X14 (Bone Implant) ×3 IMPLANT
ALLOGRAFT TRIAD LORDOTIC CC (Bone Implant) ×3 IMPLANT
BIT DRILL NEURO 2X3.1 SFT TUCH (MISCELLANEOUS) ×1 IMPLANT
BIT DRILL POWER (BIT) ×1 IMPLANT
BLADE 10 SAFETY STRL DISP (BLADE) ×3 IMPLANT
CONT SPECI 4OZ STER CLIK (MISCELLANEOUS) ×3 IMPLANT
DRAPE POUCH INSTRU U-SHP 10X18 (DRAPES) ×3 IMPLANT
DRILL BIT POWER (BIT) ×2
DRILL NEURO 2X3.1 SOFT TOUCH (MISCELLANEOUS) ×3
ELECT REM PT RETURN 9FT ADLT (ELECTROSURGICAL) ×3
ELECTRODE REM PT RTRN 9FT ADLT (ELECTROSURGICAL) ×1 IMPLANT
GLOVE BIOGEL M 8.0 STRL (GLOVE) ×3 IMPLANT
GLOVE BIOGEL PI IND STRL 8 (GLOVE) ×1 IMPLANT
GLOVE BIOGEL PI IND STRL 8.5 (GLOVE) ×1 IMPLANT
GLOVE BIOGEL PI INDICATOR 8 (GLOVE) ×2
GLOVE BIOGEL PI INDICATOR 8.5 (GLOVE) ×2
GLOVE ECLIPSE 7.5 STRL STRAW (GLOVE) ×12 IMPLANT
GLOVE ECLIPSE 8.5 STRL (GLOVE) ×3 IMPLANT
GOWN STRL REUS W/ TWL LRG LVL3 (GOWN DISPOSABLE) ×1 IMPLANT
GOWN STRL REUS W/TWL 2XL LVL3 (GOWN DISPOSABLE) ×6 IMPLANT
GOWN STRL REUS W/TWL LRG LVL3 (GOWN DISPOSABLE) ×2
KIT BASIN OR (CUSTOM PROCEDURE TRAY) ×3 IMPLANT
KIT ROOM TURNOVER OR (KITS) ×3 IMPLANT
KNIFE ARACHNOID DISP AM-24-S (MISCELLANEOUS) ×3 IMPLANT
LIQUID BAND (GAUZE/BANDAGES/DRESSINGS) ×3 IMPLANT
NS IRRIG 1000ML POUR BTL (IV SOLUTION) ×3 IMPLANT
PACK LAMINECTOMY NEURO (CUSTOM PROCEDURE TRAY) ×3 IMPLANT
PAD ARMBOARD 7.5X6 YLW CONV (MISCELLANEOUS) ×9 IMPLANT
PLATE ARCHON 2-LEVEL 44MM (Plate) ×3 IMPLANT
SCREW ARCHON ST VAR 4.0X15MM (Screw) ×12 IMPLANT
SCREW BN 15X4XST VA NS SPN (Screw) ×6 IMPLANT
SPONGE INTESTINAL PEANUT (DISPOSABLE) ×3 IMPLANT
SPONGE SURGIFOAM ABS GEL SZ50 (HEMOSTASIS) ×3 IMPLANT
SUT VIC AB 3-0 SH 18 (SUTURE) ×6 IMPLANT
TOWEL OR 17X24 6PK STRL BLUE (TOWEL DISPOSABLE) ×3 IMPLANT
TOWEL OR 17X26 10 PK STRL BLUE (TOWEL DISPOSABLE) ×3 IMPLANT
WATER STERILE IRR 1000ML POUR (IV SOLUTION) ×3 IMPLANT

## 2014-06-09 SURGICAL SUPPLY — 45 items
BAG DECANTER FOR FLEXI CONT (MISCELLANEOUS) ×3 IMPLANT
BIT DRILL NEURO 2X3.1 SFT TUCH (MISCELLANEOUS) ×1 IMPLANT
BNDG GAUZE ELAST 4 BULKY (GAUZE/BANDAGES/DRESSINGS) IMPLANT
BUR BARREL STRAIGHT FLUTE 4.0 (BURR) ×3 IMPLANT
CANISTER SUCT 3000ML (MISCELLANEOUS) ×3 IMPLANT
CONT SPEC 4OZ CLIKSEAL STRL BL (MISCELLANEOUS) ×3 IMPLANT
DECANTER SPIKE VIAL GLASS SM (MISCELLANEOUS) ×3 IMPLANT
DRAPE LAPAROTOMY 100X72 PEDS (DRAPES) ×3 IMPLANT
DRAPE MICROSCOPE LEICA (MISCELLANEOUS) IMPLANT
DRAPE POUCH INSTRU U-SHP 10X18 (DRAPES) ×3 IMPLANT
DRILL NEURO 2X3.1 SOFT TOUCH (MISCELLANEOUS) ×3
DURAPREP 6ML APPLICATOR 50/CS (WOUND CARE) ×3 IMPLANT
ELECT REM PT RETURN 9FT ADLT (ELECTROSURGICAL) ×3
ELECTRODE REM PT RTRN 9FT ADLT (ELECTROSURGICAL) ×1 IMPLANT
GAUZE SPONGE 4X4 12PLY STRL (GAUZE/BANDAGES/DRESSINGS) ×3 IMPLANT
GAUZE SPONGE 4X4 16PLY XRAY LF (GAUZE/BANDAGES/DRESSINGS) IMPLANT
GLOVE BIOGEL PI IND STRL 8.5 (GLOVE) ×1 IMPLANT
GLOVE BIOGEL PI INDICATOR 8.5 (GLOVE) ×2
GLOVE ECLIPSE 8.5 STRL (GLOVE) ×3 IMPLANT
GLOVE EXAM NITRILE LRG STRL (GLOVE) IMPLANT
GLOVE EXAM NITRILE MD LF STRL (GLOVE) IMPLANT
GLOVE EXAM NITRILE XL STR (GLOVE) IMPLANT
GLOVE EXAM NITRILE XS STR PU (GLOVE) IMPLANT
GOWN STRL REUS W/ TWL LRG LVL3 (GOWN DISPOSABLE) IMPLANT
GOWN STRL REUS W/ TWL XL LVL3 (GOWN DISPOSABLE) IMPLANT
GOWN STRL REUS W/TWL 2XL LVL3 (GOWN DISPOSABLE) ×3 IMPLANT
GOWN STRL REUS W/TWL LRG LVL3 (GOWN DISPOSABLE)
GOWN STRL REUS W/TWL XL LVL3 (GOWN DISPOSABLE)
HALTER HD/CHIN CERV TRACTION D (MISCELLANEOUS) ×3 IMPLANT
KIT BASIN OR (CUSTOM PROCEDURE TRAY) ×3 IMPLANT
KIT ROOM TURNOVER OR (KITS) ×3 IMPLANT
LIQUID BAND (GAUZE/BANDAGES/DRESSINGS) ×3 IMPLANT
NEEDLE HYPO 22GX1.5 SAFETY (NEEDLE) ×3 IMPLANT
NEEDLE SPNL 22GX3.5 QUINCKE BK (NEEDLE) ×3 IMPLANT
NS IRRIG 1000ML POUR BTL (IV SOLUTION) ×3 IMPLANT
PACK LAMINECTOMY NEURO (CUSTOM PROCEDURE TRAY) ×3 IMPLANT
PAD ARMBOARD 7.5X6 YLW CONV (MISCELLANEOUS) ×9 IMPLANT
RUBBERBAND STERILE (MISCELLANEOUS) IMPLANT
SPONGE INTESTINAL PEANUT (DISPOSABLE) ×3 IMPLANT
SPONGE SURGIFOAM ABS GEL SZ50 (HEMOSTASIS) ×3 IMPLANT
SUT VIC AB 3-0 SH 8-18 (SUTURE) ×6 IMPLANT
SYR 20ML ECCENTRIC (SYRINGE) ×3 IMPLANT
TOWEL OR 17X24 6PK STRL BLUE (TOWEL DISPOSABLE) ×3 IMPLANT
TOWEL OR 17X26 10 PK STRL BLUE (TOWEL DISPOSABLE) ×3 IMPLANT
WATER STERILE IRR 1000ML POUR (IV SOLUTION) ×3 IMPLANT

## 2014-06-09 NOTE — Anesthesia Preprocedure Evaluation (Addendum)
Anesthesia Evaluation  Patient identified by MRN, date of birth, ID band Patient awake    Reviewed: Allergy & Precautions, NPO status , Patient's Chart, lab work & pertinent test results  History of Anesthesia Complications Negative for: history of anesthetic complications  Airway Mallampati: III  TM Distance: >3 FB Neck ROM: Limited    Dental  (+) Teeth Intact, Dental Advisory Given   Pulmonary neg pulmonary ROS,    Pulmonary exam normal       Cardiovascular hypertension, Pt. on medications + dysrhythmias Atrial Fibrillation  Left ventricle: Technically limited study. EF may be in the 40% range. Septal dyssynergy. The cavity size was normal. Wall thickness was increased in a pattern of moderate LVH. Images were inadequate for LV wall motion assessment. - Aortic valve: Sclerosis without stenosis. There was mild regurgitation. - Aorta: Aortic root size is upper normal. Aortic root dimension: 40 mm (ED). - Left atrium: The atrium was mildly dilated. - Right ventricle: The cavity size was normal. Systolic function was normal. - Right atrium: The atrium was mildly dilated. - Pericardium, extracardiac: There is a small/moderate circumferential pericardial effusion. There is no evidence of tamponade.    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 negative psych ROS   GI/Hepatic negative GI ROS, Neg liver ROS,   Endo/Other  diabetes, Type 2  Renal/GU negative Renal ROS     Musculoskeletal   Abdominal   Peds  Hematology   Anesthesia Other Findings   Reproductive/Obstetrics                       Anesthesia Physical Anesthesia Plan  ASA: III  Anesthesia Plan: General   Post-op Pain Management:    Induction: Intravenous  Airway Management Planned: Oral ETT  Additional Equipment:   Intra-op Plan:   Post-operative Plan: Extubation in OR  Informed Consent: I  have reviewed the patients History and Physical, chart, labs and discussed the procedure including the risks, benefits and alternatives for the proposed anesthesia with the patient or authorized representative who has indicated his/her understanding and acceptance.   Dental advisory given and Consent reviewed with POA  Plan Discussed with: CRNA, Anesthesiologist and Surgeon  Anesthesia Plan Comments:        Anesthesia Quick Evaluation
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
## 2014-06-09 NOTE — Progress Notes (Signed)
Patient ID: Gabriel Riley, male   DOB: 1940-10-14, 73 y.o.   MRN: [id number] Postop confusion and agitation noted. This may be somewhat better now at the time of this writing however patient has been confabulating some even in my presence. His incision is clean and dry. Overall he does not seem to be complaining of the pain you know he is moving his arms and his legs better than he had been preoperatively. Postop confusion should clear spontaneously.

## 2014-06-09 NOTE — Progress Notes (Signed)
Patient in surgery, Available if needed for medical issues [phone]. Will see tomm. Gabriel Riley
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
## 2014-06-09 NOTE — Anesthesia Postprocedure Evaluation (Signed)
Anesthesia Post Note  Patient: Gabriel Riley  Procedure(s) Performed: Procedure(s) (LRB): ANTERIOR CERVICAL DECOMPRESSION/DISCECTOMY FUSION 2 LEVEL CERVICALFIVE-SIX CRVICAL SIX-SEVEN  (N/A)  Anesthesia type: general  Patient location: PACU  Post pain: Pain level controlled  Post assessment: Patient's Cardiovascular Status Stable  Last Vitals:  Filed Vitals:   06/09/14 1322  BP: 158/97  Pulse: 88  Temp:   Resp: 19    Post vital signs: Reviewed and stable  Level of consciousness: sedated  Complications: No apparent anesthesia complications

## 2014-06-09 NOTE — Anesthesia Procedure Notes (Signed)
Procedure Name: Intubation Date/Time: 06/09/2014 11:12 AM Performed by: Garner Nash Pre-anesthesia Checklist: Patient identified, Timeout performed, Emergency Drugs available, Suction available and Patient being monitored Patient Re-evaluated:Patient Re-evaluated prior to inductionOxygen Delivery Method: Circle system utilized Preoxygenation: Pre-oxygenation with 100% oxygen Intubation Type: IV induction Ventilation: Mask ventilation without difficulty Grade View: Grade I Tube type: Oral Tube size: 7.5 mm Number of attempts: 1 Airway Equipment and Method: Video-laryngoscopy Placement Confirmation: ETT inserted through vocal cords under direct vision,  breath sounds checked- equal and bilateral,  positive ETCO2 and CO2 detector Secured at: 22 cm Tube secured with: Tape Dental Injury: Teeth and Oropharynx as per pre-operative assessment  Comments: C-Collar in place. Head/neck midline stabilization maintained. GLidescope utilized without complication.
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
## 2014-06-09 NOTE — Progress Notes (Signed)
Pt reported seeing bugs on the ceiling at 3:00 AM.  I held the Tramadol, after speaking with pharmacy.  Patient had reported most of his pain 7/10, with this I had medicated the patient to help relieve the pain. The pharmacist said that the hallucinations were coming from his medications.  I talked with the son and he understood and when he asked his father again about his pain, the patient stated he did not have any pain.

## 2014-06-09 NOTE — Transfer of Care (Signed)
Immediate Anesthesia Transfer of Care Note  Patient: Gabriel Riley  Procedure(s) Performed: Procedure(s): ANTERIOR CERVICAL DECOMPRESSION/DISCECTOMY FUSION 2 LEVEL CERVICALFIVE-SIX CRVICAL SIX-SEVEN  (N/A)  Patient Location: PACU  Anesthesia Type:General  Level of Consciousness: awake, alert  and confused. Oriented to place and year.  Airway & Oxygen Therapy: Patient Spontanous Breathing and Patient connected to nasal cannula oxygen  Post-op Assessment: Report given to PACU RN and Post -op Vital signs reviewed and stable  Post vital signs: Reviewed and stable  Complications: No apparent anesthesia complications
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
## 2014-06-10 DIAGNOSIS — E118 Type 2 diabetes mellitus with unspecified complications: Secondary | ICD-10-CM

## 2014-06-10 LAB — GLUCOSE, CAPILLARY
GLUCOSE-CAPILLARY: 123 mg/dL — AB (ref 70–99)
GLUCOSE-CAPILLARY: 131 mg/dL — AB (ref 70–99)
Glucose-Capillary: 136 mg/dL — ABNORMAL HIGH (ref 70–99)
Glucose-Capillary: 155 mg/dL — ABNORMAL HIGH (ref 70–99)

## 2014-06-10 MED ORDER — FLEET ENEMA 7-19 GM/118ML RE ENEM: 1.0000 | ENEMA | Freq: Every day | RECTAL | Status: DC | PRN
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
## 2014-06-10 NOTE — Progress Notes (Signed)
Pt is much improved on mental status. He is cooperative and taking all meds and following commands.  Pt is currently c/o constipation. Gave all ordered LOC and digitally checked. Pt is not impacted however stool is at right at the rectum. He is straining hard and is bleeding some at the rectum.  Suppository given as well. Will monitor again.

## 2014-06-10 NOTE — Progress Notes (Signed)
Patient ID: Gabriel Riley, male   DOB: 1941/01/17, 73 y.o.   MRN: [id number] BP 101/77 mmHg  Pulse 81  Temp(Src) 97.8 F (36.6 C) (Oral)  Resp 18  Ht 6' (1.829 m)  Wt 88.9 kg (195 lb 15.8 oz)  BMI 26.58 kg/m2  SpO2 96% Alert moving upper extremities Shoulder pain significantly improved Wound is clean, dry, without signs of infection
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
## 2014-06-11 LAB — GLUCOSE, CAPILLARY
GLUCOSE-CAPILLARY: 135 mg/dL — AB (ref 70–99)
Glucose-Capillary: 79 mg/dL (ref 70–99)
Glucose-Capillary: 84 mg/dL (ref 70–99)
Glucose-Capillary: 79 mg/dL (ref 70–99)

## 2014-06-11 LAB — BASIC METABOLIC PANEL
Anion gap: 9 (ref 5–15)
BUN: 23 mg/dL (ref 6–23)
CALCIUM: 8.9 mg/dL (ref 8.4–10.5)
CO2: 29 mEq/L (ref 19–32)
Chloride: 103 mEq/L (ref 96–112)
Creatinine, Ser: 1.03 mg/dL (ref 0.50–1.35)
GFR, EST AFRICAN AMERICAN: 82 mL/min — AB (ref >90)
GFR, EST NON AFRICAN AMERICAN: 71 mL/min — AB (ref >90)
Glucose, Bld: 82 mg/dL (ref 70–99)
POTASSIUM: 4.1 meq/L (ref 3.7–5.3)
Sodium: 141 mEq/L (ref 137–147)

## 2014-06-11 LAB — CBC
HEMATOCRIT: 30.3 % — AB (ref 39.0–52.0)
Hemoglobin: 9.6 g/dL — ABNORMAL LOW (ref 13.0–17.0)
MCH: 29.9 pg (ref 26.0–34.0)
MCHC: 31.7 g/dL (ref 30.0–36.0)
MCV: 94.4 fL (ref 78.0–100.0)
Platelets: 248 10*3/uL (ref 150–400)
RBC: 3.21 MIL/uL — ABNORMAL LOW (ref 4.22–5.81)
RDW: 14.6 % (ref 11.5–15.5)
WBC: 8.5 10*3/uL (ref 4.0–10.5)
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
## 2014-06-11 NOTE — Progress Notes (Signed)
Patient ID: Gabriel Riley, male   DOB: Mar 13, 1941, 73 y.o.   MRN: [id number] Vital signs are stable Patient's motor function in the upper extremities seems to be improving His pain level is markedly improved compared to preoperatively I have advised that the patient may resume normal activities as he tolerates. I will see him in follow-up as an outpatient in 3-4 weeks I have discussed his wound care with his family Showering with soap and water applied to the area of the incision is not a problem
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
## 2014-06-12 ENCOUNTER — Inpatient Hospital Stay (HOSPITAL_COMMUNITY)
Admission: RE | Admit: 2014-06-12 | Discharge: 2014-06-27 | DRG: 949 | Disposition: A | Discharge status: Skilled Nursing Facility | Payer: No Typology Code available for payment source | Source: Intra-hospital | Attending: Physical Medicine & Rehabilitation | Admitting: Physical Medicine & Rehabilitation

## 2014-06-12 ENCOUNTER — Encounter (HOSPITAL_COMMUNITY): Payer: Self-pay | Admitting: Neurological Surgery

## 2014-06-12 DIAGNOSIS — S80211D Abrasion, right knee, subsequent encounter: Secondary | ICD-10-CM | POA: Diagnosis not present

## 2014-06-12 DIAGNOSIS — S80212D Abrasion, left knee, subsequent encounter: Secondary | ICD-10-CM | POA: Diagnosis not present

## 2014-06-12 DIAGNOSIS — S42301A Unspecified fracture of shaft of humerus, right arm, initial encounter for closed fracture: Secondary | ICD-10-CM | POA: Diagnosis present

## 2014-06-12 DIAGNOSIS — S069X2A Unspecified intracranial injury with loss of consciousness of 31 minutes to 59 minutes, initial encounter: Secondary | ICD-10-CM | POA: Diagnosis present

## 2014-06-12 DIAGNOSIS — S42201D Unspecified fracture of upper end of right humerus, subsequent encounter for fracture with routine healing: Secondary | ICD-10-CM | POA: Diagnosis not present

## 2014-06-12 DIAGNOSIS — M5410 Radiculopathy, site unspecified: Secondary | ICD-10-CM | POA: Diagnosis present

## 2014-06-12 DIAGNOSIS — K5900 Constipation, unspecified: Secondary | ICD-10-CM | POA: Diagnosis present

## 2014-06-12 DIAGNOSIS — N390 Urinary tract infection, site not specified: Secondary | ICD-10-CM | POA: Diagnosis not present

## 2014-06-12 DIAGNOSIS — S42301D Unspecified fracture of shaft of humerus, right arm, subsequent encounter for fracture with routine healing: Secondary | ICD-10-CM | POA: Diagnosis not present

## 2014-06-12 DIAGNOSIS — S142XXD Injury of nerve root of cervical spine, subsequent encounter: Secondary | ICD-10-CM | POA: Diagnosis not present

## 2014-06-12 DIAGNOSIS — S0003XD Contusion of scalp, subsequent encounter: Secondary | ICD-10-CM | POA: Diagnosis not present

## 2014-06-12 DIAGNOSIS — D62 Acute posthemorrhagic anemia: Secondary | ICD-10-CM | POA: Diagnosis present

## 2014-06-12 DIAGNOSIS — I482 Chronic atrial fibrillation: Secondary | ICD-10-CM | POA: Diagnosis not present

## 2014-06-12 DIAGNOSIS — T1490 Injury, unspecified: Secondary | ICD-10-CM | POA: Diagnosis not present

## 2014-06-12 DIAGNOSIS — S42301S Unspecified fracture of shaft of humerus, right arm, sequela: Secondary | ICD-10-CM | POA: Diagnosis not present

## 2014-06-12 DIAGNOSIS — E119 Type 2 diabetes mellitus without complications: Secondary | ICD-10-CM

## 2014-06-12 DIAGNOSIS — S069X2S Unspecified intracranial injury with loss of consciousness of 31 minutes to 59 minutes, sequela: Secondary | ICD-10-CM | POA: Diagnosis not present

## 2014-06-12 DIAGNOSIS — E1140 Type 2 diabetes mellitus with diabetic neuropathy, unspecified: Secondary | ICD-10-CM | POA: Diagnosis not present

## 2014-06-12 DIAGNOSIS — G629 Polyneuropathy, unspecified: Secondary | ICD-10-CM | POA: Diagnosis present

## 2014-06-12 DIAGNOSIS — R413 Other amnesia: Secondary | ICD-10-CM | POA: Diagnosis present

## 2014-06-12 DIAGNOSIS — E11351 Type 2 diabetes mellitus with proliferative diabetic retinopathy with macular edema: Secondary | ICD-10-CM | POA: Diagnosis not present

## 2014-06-12 DIAGNOSIS — M5412 Radiculopathy, cervical region: Secondary | ICD-10-CM | POA: Diagnosis present

## 2014-06-12 DIAGNOSIS — S140XXD Concussion and edema of cervical spinal cord, subsequent encounter: Secondary | ICD-10-CM | POA: Diagnosis not present

## 2014-06-12 DIAGNOSIS — I10 Essential (primary) hypertension: Secondary | ICD-10-CM | POA: Diagnosis present

## 2014-06-12 DIAGNOSIS — M5010 Cervical disc disorder with radiculopathy, unspecified cervical region: Secondary | ICD-10-CM | POA: Diagnosis not present

## 2014-06-12 DIAGNOSIS — S069X9S Unspecified intracranial injury with loss of consciousness of unspecified duration, sequela: Secondary | ICD-10-CM | POA: Diagnosis not present

## 2014-06-12 DIAGNOSIS — E11649 Type 2 diabetes mellitus with hypoglycemia without coma: Secondary | ICD-10-CM | POA: Diagnosis not present

## 2014-06-12 DIAGNOSIS — G4701 Insomnia due to medical condition: Secondary | ICD-10-CM | POA: Diagnosis present

## 2014-06-12 DIAGNOSIS — T1490XA Injury, unspecified, initial encounter: Secondary | ICD-10-CM | POA: Insufficient documentation

## 2014-06-12 DIAGNOSIS — M792 Neuralgia and neuritis, unspecified: Secondary | ICD-10-CM | POA: Diagnosis not present

## 2014-06-12 DIAGNOSIS — I4891 Unspecified atrial fibrillation: Secondary | ICD-10-CM | POA: Diagnosis not present

## 2014-06-12 DIAGNOSIS — S069X3S Unspecified intracranial injury with loss of consciousness of 1 hour to 5 hours 59 minutes, sequela: Secondary | ICD-10-CM | POA: Diagnosis not present

## 2014-06-12 DIAGNOSIS — A499 Bacterial infection, unspecified: Secondary | ICD-10-CM | POA: Diagnosis not present

## 2014-06-12 DIAGNOSIS — S42309A Unspecified fracture of shaft of humerus, unspecified arm, initial encounter for closed fracture: Secondary | ICD-10-CM

## 2014-06-12 DIAGNOSIS — S1982XA Other specified injuries of cervical trachea, initial encounter: Secondary | ICD-10-CM | POA: Diagnosis not present

## 2014-06-12 DIAGNOSIS — S069X4S Unspecified intracranial injury with loss of consciousness of 6 hours to 24 hours, sequela: Secondary | ICD-10-CM | POA: Diagnosis not present

## 2014-06-12 LAB — GLUCOSE, CAPILLARY
GLUCOSE-CAPILLARY: 76 mg/dL (ref 70–99)
GLUCOSE-CAPILLARY: 122 mg/dL — AB (ref 70–99)
GLUCOSE-CAPILLARY: 149 mg/dL — AB (ref 70–99)
GLUCOSE-CAPILLARY: 125 mg/dL — AB (ref 70–99)

## 2014-06-12 MED ORDER — LINAGLIPTIN 5 MG PO TABS
5.0000 mg | ORAL_TABLET | Freq: Every day | ORAL | Status: DC
  Administered 2014-06-13 – 2014-06-27 (×15): 5 mg via ORAL
  Filled 2014-06-12 (×19): qty 1

## 2014-06-12 MED ORDER — AMLODIPINE BESYLATE 10 MG PO TABS
10.0000 mg | ORAL_TABLET | Freq: Every evening | ORAL | Status: DC
  Administered 2014-06-12 – 2014-06-26 (×15): 10 mg via ORAL
  Filled 2014-06-12 (×16): qty 1

## 2014-06-12 MED ORDER — GLIMEPIRIDE 4 MG PO TABS
4.0000 mg | ORAL_TABLET | Freq: Two times a day (BID) | ORAL | Status: DC
  Administered 2014-06-12 – 2014-06-24 (×25): 4 mg via ORAL
  Filled 2014-06-12 (×28): qty 1

## 2014-06-12 MED ORDER — BISACODYL 10 MG RE SUPP
10.0000 mg | Freq: Every day | RECTAL | Status: DC | PRN
Start: 2014-06-12 — End: 2014-06-27

## 2014-06-12 MED ORDER — DIGOXIN 125 MCG PO TABS
0.1250 mg | ORAL_TABLET | Freq: Every day | ORAL | Status: DC
  Administered 2014-06-13 – 2014-06-26 (×14): 0.125 mg via ORAL
  Filled 2014-06-12 (×16): qty 1

## 2014-06-12 MED ORDER — FLEET ENEMA 7-19 GM/118ML RE ENEM
1.0000 | ENEMA | Freq: Every day | RECTAL | Status: DC | PRN
  Filled 2014-06-12: qty 1

## 2014-06-12 MED ORDER — GUAIFENESIN-DM 100-10 MG/5ML PO SYRP: 5.0000 mL | ORAL_SOLUTION | Freq: Four times a day (QID) | ORAL | Status: DC | PRN

## 2014-06-12 MED ORDER — CARVEDILOL 12.5 MG PO TABS
12.5000 mg | ORAL_TABLET | Freq: Two times a day (BID) | ORAL | Status: DC
  Administered 2014-06-12 – 2014-06-27 (×30): 12.5 mg via ORAL
  Filled 2014-06-12 (×32): qty 1

## 2014-06-12 MED ORDER — INSULIN ASPART 100 UNIT/ML ~~LOC~~ SOLN
0.0000 [IU] | Freq: Three times a day (TID) | SUBCUTANEOUS | Status: DC
  Administered 2014-06-12: 3 [IU] via SUBCUTANEOUS
  Administered 2014-06-13 (×2): 4 [IU] via SUBCUTANEOUS
  Administered 2014-06-13: 3 [IU] via SUBCUTANEOUS
  Administered 2014-06-14: 4 [IU] via SUBCUTANEOUS
  Administered 2014-06-14 – 2014-06-15 (×2): 3 [IU] via SUBCUTANEOUS
  Administered 2014-06-15 – 2014-06-16 (×3): 4 [IU] via SUBCUTANEOUS
  Administered 2014-06-17: 3 [IU] via SUBCUTANEOUS
  Administered 2014-06-17 – 2014-06-18 (×2): 4 [IU] via SUBCUTANEOUS
  Administered 2014-06-19 – 2014-06-20 (×3): 3 [IU] via SUBCUTANEOUS
  Administered 2014-06-21: 7 [IU] via SUBCUTANEOUS
  Administered 2014-06-22: 3 [IU] via SUBCUTANEOUS
  Administered 2014-06-22 – 2014-06-23 (×2): 4 [IU] via SUBCUTANEOUS
  Administered 2014-06-24: 3 [IU] via SUBCUTANEOUS
  Administered 2014-06-24: 4 [IU] via SUBCUTANEOUS
  Administered 2014-06-25 (×2): 3 [IU] via SUBCUTANEOUS
  Administered 2014-06-26: 4 [IU] via SUBCUTANEOUS
  Administered 2014-06-26: 3 [IU] via SUBCUTANEOUS
  Administered 2014-06-27: 4 [IU] via SUBCUTANEOUS

## 2014-06-12 MED ORDER — ACETAMINOPHEN 325 MG PO TABS
325.0000 mg | ORAL_TABLET | ORAL | Status: DC | PRN
  Administered 2014-06-16 – 2014-06-18 (×2): 650 mg via ORAL
  Filled 2014-06-12 (×3): qty 2

## 2014-06-12 MED ORDER — TRAMADOL HCL 50 MG PO TABS
50.0000 mg | ORAL_TABLET | Freq: Four times a day (QID) | ORAL | Status: DC
  Administered 2014-06-12 – 2014-06-16 (×6): 50 mg via ORAL
  Administered 2014-06-16 – 2014-06-19 (×12): 100 mg via ORAL
  Filled 2014-06-12 (×2): qty 1
  Filled 2014-06-12: qty 2
  Filled 2014-06-12: qty 1
  Filled 2014-06-12 (×3): qty 2
  Filled 2014-06-12 (×3): qty 1
  Filled 2014-06-12 (×7): qty 2
  Filled 2014-06-12 (×2): qty 1
  Filled 2014-06-12 (×2): qty 2

## 2014-06-12 MED ORDER — DIPHENHYDRAMINE HCL 12.5 MG/5ML PO ELIX
12.5000 mg | ORAL_SOLUTION | Freq: Four times a day (QID) | ORAL | Status: DC | PRN
  Filled 2014-06-12: qty 10

## 2014-06-12 MED ORDER — TRAZODONE HCL 50 MG PO TABS
25.0000 mg | ORAL_TABLET | Freq: Every evening | ORAL | Status: DC | PRN
  Administered 2014-06-17: 50 mg via ORAL
  Administered 2014-06-22: 25 mg via ORAL
  Administered 2014-06-23: 50 mg via ORAL
  Administered 2014-06-26 (×2): 25 mg via ORAL
  Filled 2014-06-12 (×5): qty 1

## 2014-06-12 MED ORDER — ONDANSETRON HCL 4 MG/2ML IJ SOLN: 4.0000 mg | Freq: Four times a day (QID) | INTRAMUSCULAR | Status: DC | PRN

## 2014-06-12 MED ORDER — PREGABALIN 50 MG PO CAPS
75.0000 mg | ORAL_CAPSULE | Freq: Two times a day (BID) | ORAL | Status: DC
  Administered 2014-06-12 – 2014-06-19 (×15): 75 mg via ORAL
  Filled 2014-06-12 (×32): qty 1

## 2014-06-12 MED ORDER — METHOCARBAMOL 500 MG PO TABS
500.0000 mg | ORAL_TABLET | Freq: Four times a day (QID) | ORAL | Status: DC | PRN
  Administered 2014-06-19: 500 mg via ORAL
  Filled 2014-06-12 (×3): qty 1

## 2014-06-12 MED ORDER — SENNOSIDES-DOCUSATE SODIUM 8.6-50 MG PO TABS
2.0000 | ORAL_TABLET | Freq: Two times a day (BID) | ORAL | Status: DC
  Administered 2014-06-12 – 2014-06-27 (×29): 2 via ORAL
  Filled 2014-06-12 (×12): qty 2
  Filled 2014-06-12: qty 1
  Filled 2014-06-12 (×6): qty 2
  Filled 2014-06-12: qty 1
  Filled 2014-06-12 (×4): qty 2
  Filled 2014-06-12: qty 1
  Filled 2014-06-12 (×8): qty 2

## 2014-06-12 MED ORDER — POLYETHYLENE GLYCOL 3350 17 G PO PACK
17.0000 g | PACK | Freq: Every day | ORAL | Status: DC
  Administered 2014-06-13 – 2014-06-26 (×9): 17 g via ORAL
  Filled 2014-06-12 (×17): qty 1

## 2014-06-12 MED ORDER — MORPHINE SULFATE ER 15 MG PO TBCR
15.0000 mg | EXTENDED_RELEASE_TABLET | Freq: Two times a day (BID) | ORAL | Status: DC
  Administered 2014-06-12 – 2014-06-14 (×4): 15 mg via ORAL
  Filled 2014-06-12 (×6): qty 1

## 2014-06-12 MED ORDER — MENTHOL 3 MG MT LOZG
1.0000 | LOZENGE | OROMUCOSAL | Status: DC | PRN
  Filled 2014-06-12: qty 9

## 2014-06-12 MED ORDER — ALUM & MAG HYDROXIDE-SIMETH 200-200-20 MG/5ML PO SUSP
30.0000 mL | Freq: Four times a day (QID) | ORAL | Status: DC | PRN
  Filled 2014-06-12: qty 30

## 2014-06-12 MED ORDER — ONDANSETRON HCL 4 MG PO TABS
4.0000 mg | ORAL_TABLET | Freq: Four times a day (QID) | ORAL | Status: DC | PRN
Start: 2014-06-12 — End: 2014-06-27

## 2014-06-12 MED ORDER — PHENOL 1.4 % MT LIQD
1.0000 | OROMUCOSAL | Status: DC | PRN
  Filled 2014-06-12: qty 177

## 2014-06-12 NOTE — Progress Notes (Signed)
Rehab admissions - I am following pt's case and we have an available rehab bed. I spoke with Legrand Como, trauma PA and trauma gave medical clearance for pt to come to inpatient rehab. Trauma did recommend that pt receive follow up SLP eval as pt reports some new issues with swallowing. Rehab MD in agreement with this.  I called and updated pt about the plan to admit to inpatient rehab today. I also called and left voicemail with  pt's dtr. I updated Sharyn Lull, trauma case Oncologist, Education officer, museum. I will complete admission paperwork with pt later.   Please call me with any questions. Thanks.  Nanetta Batty, PT Rehabilitation Admissions Coordinator [phone]
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
## 2014-06-12 NOTE — Progress Notes (Signed)
Patient ID: GALEN RUSSMAN, male   DOB: 1941-06-20, 73 y.o.   MRN: [id number]   LOS: 11 days   Subjective: Has some coughing after swallow.   Objective: Vital signs in last 24 hours: Temp:  [98.5 F (36.9 C)-98.7 F (37.1 C)] 98.5 F (36.9 C) (12/22 0141) Pulse Rate:  [78-80] 80 (12/22 0638) Resp:  [16] 16 (12/21 1642) BP: (122-132)/(60-85) 132/79 mmHg (12/22 0638) SpO2:  [92 %-96 %] 96 % (12/22 0638) Last BM Date: 06/11/14   Laboratory Results CBG (last 3)   Recent Labs  06/11/14 1703 06/11/14 2145 06/12/14 0636  GLUCAP 135* 79 76    Physical Exam General appearance: alert and no distress Resp: clear to auscultation bilaterally Cardio: regular rate and rhythm GI: normal findings: bowel sounds normal and soft, non-tender   Assessment/Plan: PHBC R prox humerus FX s/p ORIF -- by Dr. 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 Ellene Route. Will get swallow eval on CIR. A fib/DM - appreciate hospitalist F/U FEN - tolerating regular diet. Cont Miralax VTE - Lovenox, SCD's, Pradaxa on hold.  Dispo - D/C to CIR    Lisette Abu, PA-C Pager: [phone] General Trauma PA Pager: [phone]  06/12/2014
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
## 2014-06-12 NOTE — Progress Notes (Signed)
Report called to RN on [place]. Patient will be transferred to [phone] for continued rehab.
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
## 2014-06-12 NOTE — Progress Notes (Signed)
Subjective: 9 days status post ORIF right proximal humerus fracture.  No significant complaints related to the right shoulder.  Overall feeling better since neck surgery.  Objective: Vital signs in last 24 hours: Temp:  [98.5 F (36.9 C)-98.7 F (37.1 C)] 98.5 F (36.9 C) (12/22 5093) Pulse Rate:  [78-80] 80 (12/22 0638) Resp:  [16] 16 (12/21 1642) BP: (122-140)/(60-85) 132/79 mmHg (12/22 0638) SpO2:  [92 %-96 %] 96 % (12/22 2671)  Intake/Output from previous day: 12/21 0701 - 12/22 0700 In: -  Out: 350 [Urine:350] Intake/Output this shift:     Recent Labs  06/11/14 0541  HGB 9.6*    Recent Labs  06/11/14 0541  WBC 8.5  RBC 3.21*  HCT 30.3*  PLT 248    Recent Labs  06/11/14 0541  NA 141  K 4.1  CL 103  CO2 29  BUN 23  CREATININE 1.03  GLUCOSE 82  CALCIUM 8.9   No results for input(s): LABPT, INR in the last 72 hours. Right shoulder exam: Dressing clean and dry.  NV intact to right upper extremity.  Moves fingers actively.  Swelling is decreased.   Assessment/Plan: 9 days status post ORIF right proximal humerus fracture Plan: May work with physical therapy/OT.   passive range of motion forward flexion to 60.  Abduction to 60 and circumduction exercises.  May work on elbow and wrist range of motion.  Nonweightbearing right upper extremity.  Continue in sling.  We will sign off.  Please call if needed or questions.  He will need a followup with Dr. Berenice Primas in the first week of January in the office.   Ridley Schewe G 06/12/2014, 10:05 AM
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
## 2014-06-12 NOTE — Progress Notes (Signed)
Received pt. As a transfer from [place].Pt. And daughter oriented to unit and protocol.Fall prevention plan was explained and signed by the pt's daughter and RN.Safety plan was explained as well.Safety video was showed to pt. And his daughter.Pt. Has an incision on Rt. Shoulder with foam on.;an abrasion with mepilex on on Lt. Knee.Several bruises on arms,legs,flank area.Pt's buttocks is red and blanchable.Keep monitoring pt. Closely and assessing his needs.
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
## 2014-06-13 ENCOUNTER — Inpatient Hospital Stay (HOSPITAL_COMMUNITY): Payer: No Typology Code available for payment source | Admitting: Physical Therapy

## 2014-06-13 ENCOUNTER — Inpatient Hospital Stay (HOSPITAL_COMMUNITY): Payer: Medicare Other | Admitting: Speech Pathology

## 2014-06-13 ENCOUNTER — Inpatient Hospital Stay (HOSPITAL_COMMUNITY): Payer: No Typology Code available for payment source | Admitting: Occupational Therapy

## 2014-06-13 LAB — CBC WITH DIFFERENTIAL/PLATELET
Basophils Absolute: 0 10*3/uL (ref 0.0–0.1)
Basophils Relative: 0 % (ref 0–1)
EOS PCT: 4 % (ref 0–5)
Eosinophils Absolute: 0.3 10*3/uL (ref 0.0–0.7)
HCT: 30.3 % — ABNORMAL LOW (ref 39.0–52.0)
HEMOGLOBIN: 9.4 g/dL — AB (ref 13.0–17.0)
LYMPHS PCT: 13 % (ref 12–46)
Lymphs Abs: 1 10*3/uL (ref 0.7–4.0)
MCH: 29.8 pg (ref 26.0–34.0)
MCHC: 31 g/dL (ref 30.0–36.0)
MCV: 96.2 fL (ref 78.0–100.0)
MONOS PCT: 11 % (ref 3–12)
Monocytes Absolute: 0.8 10*3/uL (ref 0.1–1.0)
NEUTROS ABS: 5.6 10*3/uL (ref 1.7–7.7)
Neutrophils Relative %: 72 % (ref 43–77)
Platelets: 232 10*3/uL (ref 150–400)
RBC: 3.15 MIL/uL — ABNORMAL LOW (ref 4.22–5.81)
RDW: 15.1 % (ref 11.5–15.5)
WBC: 7.8 10*3/uL (ref 4.0–10.5)

## 2014-06-13 LAB — COMPREHENSIVE METABOLIC PANEL
ALT: 13 U/L (ref 0–53)
AST: 23 U/L (ref 0–37)
Albumin: 2.3 g/dL — ABNORMAL LOW (ref 3.5–5.2)
Alkaline Phosphatase: 62 U/L (ref 39–117)
Anion gap: 6 (ref 5–15)
BILIRUBIN TOTAL: 1 mg/dL (ref 0.3–1.2)
BUN: 20 mg/dL (ref 6–23)
CALCIUM: 8.6 mg/dL (ref 8.4–10.5)
CHLORIDE: 102 meq/L (ref 96–112)
CO2: 29 mmol/L (ref 19–32)
CREATININE: 1.27 mg/dL (ref 0.50–1.35)
GFR, EST AFRICAN AMERICAN: 63 mL/min — AB (ref >90)
GFR, EST NON AFRICAN AMERICAN: 55 mL/min — AB (ref >90)
GLUCOSE: 190 mg/dL — AB (ref 70–99)
Potassium: 4.3 mmol/L (ref 3.5–5.1)
Sodium: 137 mmol/L (ref 135–145)
Total Protein: 5.1 g/dL — ABNORMAL LOW (ref 6.0–8.3)

## 2014-06-13 LAB — GLUCOSE, CAPILLARY
Glucose-Capillary: 179 mg/dL — ABNORMAL HIGH (ref 70–99)
Glucose-Capillary: 139 mg/dL — ABNORMAL HIGH (ref 70–99)
Glucose-Capillary: 175 mg/dL — ABNORMAL HIGH (ref 70–99)
Glucose-Capillary: 133 mg/dL — ABNORMAL HIGH (ref 70–99)

## 2014-06-13 NOTE — Progress Notes (Addendum)
   06/13/14 0930  What Happened  Was fall witnessed? Yes  Who witnessed fall? Estill Bamberg Hyslop,PT (physical therapist)  Patients activity before fall during therapy;ambulating-assisted  Point of contact buttocks  Was patient injured? No  Follow Up  MD notified Hadassah Pais  Time MD notified [phone]  Family notified Yes-comment  Time family notified 1115  Additional tests No  Progress note created (see row info) Yes  Adult Fall Risk Assessment  Risk Factor Category (scoring not indicated) Fall has occurred during this admission (document High fall risk)  Age 73  Fall History: Fall within 6 months prior to admission 0  Elimination; Bowel and/or Urine Incontinence 0  Elimination; Bowel and/or Urine Urgency/Frequency 0  Medications: includes PCA/Opiates, Anti-convulsants, Anti-hypertensives, Diuretics, Hypnotics, Laxatives, Sedatives, and Psychotropics 5  Patient Care Equipment 2  Mobility-Assistance 2  Mobility-Gait 2  Mobility-Sensory Deficit 0  Cognition-Awareness 0  Cognition-Impulsiveness 0  Cognition-Limitations 0  Total Score 13  Patient's Fall Risk High Fall Risk (>13 points)  Adult Fall Risk Interventions  Required Bundle Interventions *See Row Information* High fall risk - low, moderate, and high requirements implemented  Additional Interventions Individualized elimination schedule;Fall risk signage  Vitals  Temp 98.6 F (37 C)  Temp Source Oral  BP 118/79 mmHg  BP Location Left Arm  BP Method Automatic  Patient Position (if appropriate) Lying  Pulse Rate 74  Pulse Rate Source Dinamap  Resp 18  Oxygen Therapy  SpO2 99 %  O2 Device Room Air  Pain Assessment  Pain Assessment No/denies pain  Pain Score 0  Neurological  Neuro (WDL) WDL (reassessment d/t an assisted fall)  Level of Consciousness Alert  Orientation Level Oriented X4  Cognition Appropriate at baseline  Speech Clear  RUE Motor Strength 3  LUE Motor Strength 4  RLE Motor Strength 4  LLE Motor  Strength 4  Musculoskeletal  Musculoskeletal (WDL) X  Assistive Device Other (Comment);Wheelchair (using a hemicane to assisted to standing position)  Generalized Weakness Yes  Weight Bearing Restrictions Yes  RUE Weight Bearing NWB  Musculoskeletal Details  RUE Limited movement  RUE Ortho/Supportive Device Sling  Neck Limited movement  Integumentary  Integumentary (WDL) X (no new skin issues noted )  posted note for Scarlette Slice, LPN.  [place], Charbel [place] Eon
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
## 2014-06-13 NOTE — Progress Notes (Signed)
Per Dr. Morton Peters does not need a collar and can shower and cleanse incision with soap and water.
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
## 2014-06-13 NOTE — Progress Notes (Signed)
Patient information reviewed and entered into eRehab system by Burnadette Baskett, RN, CRRN, PPS Coordinator.  Information including medical coding and functional independence measure will be reviewed and updated through discharge.     Per nursing patient was given "Data Collection Information Summary for Patients in Inpatient Rehabilitation Facilities with attached "Privacy Act Statement-Health Care Records" upon admission.  
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
## 2014-06-14 ENCOUNTER — Inpatient Hospital Stay (HOSPITAL_COMMUNITY): Payer: No Typology Code available for payment source | Admitting: Physical Therapy

## 2014-06-14 ENCOUNTER — Inpatient Hospital Stay (HOSPITAL_COMMUNITY): Payer: No Typology Code available for payment source

## 2014-06-14 ENCOUNTER — Encounter (HOSPITAL_COMMUNITY): Payer: Self-pay | Admitting: *Deleted

## 2014-06-14 ENCOUNTER — Inpatient Hospital Stay (HOSPITAL_COMMUNITY): Payer: No Typology Code available for payment source | Admitting: Speech Pathology

## 2014-06-14 DIAGNOSIS — S42301S Unspecified fracture of shaft of humerus, right arm, sequela: Secondary | ICD-10-CM

## 2014-06-14 LAB — GLUCOSE, CAPILLARY
GLUCOSE-CAPILLARY: 101 mg/dL — AB (ref 70–99)
GLUCOSE-CAPILLARY: 144 mg/dL — AB (ref 70–99)
GLUCOSE-CAPILLARY: 108 mg/dL — AB (ref 70–99)
Glucose-Capillary: 193 mg/dL — ABNORMAL HIGH (ref 70–99)

## 2014-06-14 NOTE — Progress Notes (Signed)
Patient rates shoulder pain 6/10, patient has scheduled tramadol, patient declined medication at this time. Will continue to monitor. Oval Linsey, RN

## 2014-06-14 NOTE — Progress Notes (Signed)
Occupational Therapy Session Note  Patient Details  Name: Gabriel Riley MRN: [id number] Date of Birth: August 10, 1940  Today's Date: 06/14/2014 OT Individual Time: 0728-0828 OT Individual Time Calculation (min): 60 min    Short Term Goals: Week 1:  OT Short Term Goal 1 (Week 1): Pt will demonstrate pendulum exercises for R UE in order to demonstrate knowledge of proper technique. OT Short Term Goal 2 (Week 1): Pt will perform UB dressing with Min A in order to increase I in self care. OT Short Term Goal 3 (Week 1): Pt will perform LB dressing with Min A in order to increase I in self care. OT Short Term Goal 4 (Week 1): Pt will perform toilet transfer with Min A in order to increase I in functional transfers.   Skilled Therapeutic Interventions/Progress Updates: ADL-retraining with focus on improved transfers and functional use of right hand during BADL.   Pt received supine in bed awaiting therapist and breakfast tray.   Pt able to rise to sit at edge of bed using bed rail and HOB elevated.   With setup assist pt completed partial self-feeding with min assist to open containers.   Despite recommendation to use right hand as dominant, supported by left hand, pt was unable to perform self-feeding using right hand at all and compensated by self-feeding with left hand, awkwardly.   Pt remained cautious with use of right hand throughout session until MD visit (Dr. Berenice Primas) reinforced use of right hand, to include recommendation for  gentle ROM at elbow/forearm, continued grip strengthening, sling use and edema management.   Pt completed partial bathing seated in w/c at sink but requested assist with toilet transfer for BM.   Pt required overall mod assist with transfers, max assist for toilet hygiene at end of session with expedient assisted lower body dressing completed by therapist during prolong BM.   Pt demo'd intermittent confusion and word-finding deficits during treatment session without  impulsivity or evidence of impaired safety awareness.    Pt received by physical therapist at end of session.     Therapy Documentation Precautions:  Precautions Precautions: Fall, Cervical Precaution Comments: no pushing, pulling, lifting with RUE Required Braces or Orthoses: Sling Cervical Brace: Other (comment) (daughter has taken c-collar home - PT asked pt to have her bring it back. Pt reports surgeon said he no longer needs c-collar. ) Restrictions Weight Bearing Restrictions: Yes RUE Weight Bearing: Non weight bearing  Vital Signs: Therapy Vitals Temp: 98.4 F (36.9 C) Temp Source: Oral Pulse Rate: 77 Resp: 17 BP: 136/89 mmHg Patient Position (if appropriate): Lying Oxygen Therapy SpO2: 91 % O2 Device: Not Delivered   Pain: Pain Assessment Pain Assessment: 0-10 Pain Score: 5  Pain Type: Surgical pain Pain Location: Shoulder Pain Orientation: Right Pain Descriptors / Indicators: Sore Pain Onset: On-going Pain Intervention(s): Rest;Other (Comment) (sling repositioned) Multiple Pain Sites: Yes 2nd Pain Site Pain Score: 3 Pain Type: Acute pain Pain Location: Knee Pain Orientation: Right Pain Descriptors / Indicators: Sore Pain Onset: Gradual Pain Intervention(s): Rest  See FIM for current functional status  Therapy/Group: Individual Therapy  [place] 06/14/2014, 11:25 AM

## 2014-06-14 NOTE — Progress Notes (Signed)
Speech Language Pathology Daily Session Note  Patient Details  Name: Gabriel Riley MRN: [id number] Date of Birth: 1941-04-05  Today's Date: 06/14/2014 SLP Individual Time: 1100-1200 SLP Individual Time Calculation (min): 60 min  Short Term Goals: Week 1: SLP Short Term Goal 1 (Week 1): Pt will improve semi-complex problem solving during functional tasks over 80% of observable opportunities with supervision cues  SLP Short Term Goal 2 (Week 1): Pt will improve delayed recall of information via compensatory strategies and external aids over 80% of observable opportunities with supervision cues SLP Short Term Goal 3 (Week 1): Pt will improve word finding of abstract, semi-complex concepts at the conversational level with Mod I  Skilled Therapeutic Interventions: Skilled treatment session focused on cognitive-linguistic goals. Upon arrival, patient was asleep in bed but was easily awakened and agreeable to participate in treatment session. SLP facilitated session by educating patient on word-finding strategies and providing supervision question cues for patient to implement the strategies during a verbal description task at the sentence level. Patient independently requested to use the urinal but required extra time to complete task due to decreased problem solving. Patient left supine in bed with all needs within reach. Continue with current plan of care.    FIM:  Comprehension Comprehension Mode: Auditory Comprehension: 5-Follows basic conversation/direction: With no assist Expression Expression Mode: Verbal Expression: 5-Expresses basic 90% of the time/requires cueing < 10% of the time. Social Interaction Social Interaction: 5-Interacts appropriately 90% of the time - Needs monitoring or encouragement for participation or interaction. Problem Solving Problem Solving: 4-Solves basic 75 - 89% of the time/requires cueing 10 - 24% of the time Memory Memory: 4-Recognizes or recalls 75  - 89% of the time/requires cueing 10 - 24% of the time  Pain No/Denies Pain   Therapy/Group: Individual Therapy  [name] 06/14/2014, 3:30 PM
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
## 2014-06-14 NOTE — Progress Notes (Signed)
Physical Therapy Session Note  Patient Details  Name: Gabriel Riley MRN: [id number] Date of Birth: [birth year]  Today's Date: 06/14/2014 PT Individual Time: 0830-0930 PT Individual Time Calculation (min): 60 min   Short Term Goals: Week 1:  PT Short Term Goal 1 (Week 1): Pt will complete bed mobility with SBA and verbal cues, as needed for R UE NWB.  PT Short Term Goal 2 (Week 1): Pt will complete sit to stand with SBA and HW (or no AD).  PT Short Term Goal 3 (Week 1): Pt will ambulate 79' with HW (or no AD) req CGA.  PT Short Term Goal 4 (Week 1): Pt will self propel manual w/c x 100' with SBA.  PT Short Term Goal 5 (Week 1): Pt will transfer w/c to/from bed req SBA with HW (or no AD).   Skilled Therapeutic Interventions/Progress Updates:    W/C Management: PT instructs pt in w/c propulsion x 150' x 2 with B LEs and B LEs/L UE req SBA and verbal cues for technique. Pt demonstrates ability to lock/unlock brakes with verbal cues, today.   Gait Training: PT instructs pt in ambulation with L HW x 10' + 20' req min A for balance and verbal cues to straighten B knees - pt walks in slight crouched gait and reports that this is probably his baseline technique. As pt fatigues, his crouch deepens and he req verbal cues for upright posture.   Therapeutic Exercise: PT instructs pt in standing B LE strengthening exercises in the // bars: mini squats with L UE support x 8 reps and heel raises with L UE support x 10 reps - focus on upright posture.  PT instructs pt in R UE A/AROM exercises: fist pumps x 10 reps AROM, AAROM (self) elbow flexion/extension with arm by side x 10 reps, pendulum exercise in // bars with L forearm support in stand in // bars x 10 reps clockwise and counter-clockwise.   Therapeutic Activity: PT instructs pt in sit to stand from w/c req min-mod A with L HW or mod-max A in // bar, pending pt's fatigue level, multiple times during PT session.  Pt requests to brush teeth  and PT provides set up assist and pt completes activity from a w/c level.  PT instructs pt in w/c to bed transfer with L HW req mod A, and mod A for B LE sit to supine transfer.   Pt continues to be fatigued and was received for PT immediately following OT bathing/dressing session. PT used rest breaks throughout session and w/c follow as needed for safety. Pt is slowly increasing activity tolerance. Continue per PT POC.   Therapy Documentation Precautions:  Precautions Precautions: Fall, Cervical Precaution Comments: no pushing, pulling, lifting with RUE Required Braces or Orthoses: Sling Cervical Brace: Other (comment) (daughter has taken c-collar home - PT asked pt to have her bring it back. Pt reports surgeon said he no longer needs c-collar. ) Restrictions Weight Bearing Restrictions: Yes RUE Weight Bearing: Non weight bearing Pain: Pain Assessment Pain Assessment: 0-10 Pain Score: 5  Pain Type: Surgical pain Pain Location: Shoulder Pain Orientation: Right Pain Descriptors / Indicators: Sore Pain Onset: On-going Patients Stated Pain Goal: 2 Pain Intervention(s): Rest;Other (Comment) (sling repositioned) Multiple Pain Sites: Yes 2nd Pain Site Pain Score: 3 Pain Type: Acute pain Pain Location: Knee Pain Orientation: Right Pain Descriptors / Indicators: Sore Pain Onset: Gradual Pain Intervention(s): Rest  See FIM for current functional status  Therapy/Group: Individual Therapy  Destry Dauber M  06/14/2014, 8:36 AM
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
## 2014-06-14 NOTE — Care Management Note (Signed)
Inpatient [facility] Individual Statement of Services  Patient Name:  Gabriel Riley  Date:  06/14/2014  Welcome to the [facility].  Our goal is to provide you with an individualized program based on your diagnosis and situation, designed to meet your specific needs.  With this comprehensive rehabilitation program, you will be expected to participate in at least 3 hours of rehabilitation therapies Monday-Friday, with modified therapy programming on the weekends.  Your rehabilitation program will include the following services:  Physical Therapy (PT), Occupational Therapy (OT), Speech Therapy (ST), 24 hour per day rehabilitation nursing, Therapeutic Recreaction (TR), Neuropsychology, Case Management (Social Worker), Rehabilitation Medicine, Nutrition Services and Pharmacy Services  Weekly team conferences will be held on Tuesdays to discuss your progress.  Your Social Worker will talk with you frequently to get your input and to update you on team discussions.  Team conferences with you and your family in attendance may also be held.  Expected length of stay: 10-14 days  Overall anticipated outcome: supervision  Depending on your progress and recovery, your program may change. Your Social Worker will coordinate services and will keep you informed of any changes. Your Social Worker's name and contact numbers are listed  below.  The following services may also be recommended but are not provided by the [facility] will be made to provide these services after discharge if needed.  Arrangements include referral to agencies that provide these services.  Your insurance has been verified to be:  Medicare and Justice Your primary doctor is:  Dr. Lin Landsman  Pertinent information will be shared with your  doctor and your insurance company.  Social Worker:  [place], [place] or ([phone]   Information discussed with and copy given to patient by: Lennart Pall, 06/14/2014, 9:18 AM

## 2014-06-15 LAB — GLUCOSE, CAPILLARY
GLUCOSE-CAPILLARY: 108 mg/dL — AB (ref 70–99)
GLUCOSE-CAPILLARY: 150 mg/dL — AB (ref 70–99)
Glucose-Capillary: 155 mg/dL — ABNORMAL HIGH (ref 70–99)
Glucose-Capillary: 130 mg/dL — ABNORMAL HIGH (ref 70–99)
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
## 2014-06-16 ENCOUNTER — Inpatient Hospital Stay (HOSPITAL_COMMUNITY): Payer: No Typology Code available for payment source | Admitting: Physical Therapy

## 2014-06-16 ENCOUNTER — Inpatient Hospital Stay (HOSPITAL_COMMUNITY): Payer: No Typology Code available for payment source | Admitting: Occupational Therapy

## 2014-06-16 DIAGNOSIS — I482 Chronic atrial fibrillation: Secondary | ICD-10-CM

## 2014-06-16 LAB — GLUCOSE, CAPILLARY
Glucose-Capillary: 107 mg/dL — ABNORMAL HIGH (ref 70–99)
Glucose-Capillary: 191 mg/dL — ABNORMAL HIGH (ref 70–99)
Glucose-Capillary: 152 mg/dL — ABNORMAL HIGH (ref 70–99)
Glucose-Capillary: 119 mg/dL — ABNORMAL HIGH (ref 70–99)
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
## 2014-06-17 ENCOUNTER — Inpatient Hospital Stay (HOSPITAL_COMMUNITY): Payer: No Typology Code available for payment source | Admitting: *Deleted

## 2014-06-17 ENCOUNTER — Inpatient Hospital Stay (HOSPITAL_COMMUNITY): Payer: No Typology Code available for payment source

## 2014-06-17 DIAGNOSIS — I10 Essential (primary) hypertension: Secondary | ICD-10-CM

## 2014-06-17 LAB — GLUCOSE, CAPILLARY
GLUCOSE-CAPILLARY: 178 mg/dL — AB (ref 70–99)
Glucose-Capillary: 90 mg/dL (ref 70–99)
Glucose-Capillary: 131 mg/dL — ABNORMAL HIGH (ref 70–99)
Glucose-Capillary: 153 mg/dL — ABNORMAL HIGH (ref 70–99)

## 2014-06-17 NOTE — Progress Notes (Signed)
Occupational Therapy Session Note  Patient Details  Name: Gabriel Riley MRN: [id number] Date of Birth: Sep 17, 1940  Today's Date: 06/17/2014 OT Individual Time:  -   1000-1100 (12min)  1st session:    Short Term Goals: Week 1:  OT Short Term Goal 1 (Week 1): Pt will demonstrate pendulum exercises for R UE in order to demonstrate knowledge of proper technique. OT Short Term Goal 2 (Week 1): Pt will perform UB dressing with Min A in order to increase I in self care. OT Short Term Goal 3 (Week 1): Pt will perform LB dressing with Min A in order to increase I in self care. OT Short Term Goal 4 (Week 1): Pt will perform toilet transfer with Min A in order to increase I in functional transfers.  :     Skilled Therapeutic Interventions/Progress Updates:      1st session:  Addressed  Bathing and dressing at sink with sit to stand x 5 for all aspects of tasks.  Pt was mod assist with sit to stand and assist with eccentric control  Was able to doff footies but not donn them.  Try AE in future prn.  Pt. Needed rest breaks during session as activity progressed.  He stood for 1 minute while OT wet his hair.  Left pt in wc with next therapy session.    Therapy Documentation Precautions:  Precautions Precautions: Fall, Cervical Precaution Comments: no pushing, pulling, lifting with RUE Required Braces or Orthoses: Sling Cervical Brace: Other (comment) (daughter has taken c-collar home - PT asked pt to have her bring it back. Pt reports surgeon said he no longer needs c-collar. ) Restrictions Weight Bearing Restrictions: Yes RUE Weight Bearing: Non weight bearing   Pain: Pain Assessment Pain Score: 7/10   Right shoulder   Other Treatments:    Time: 1500-1530  (30 min)   2nd session Pain:7/10 right shoulder.  SEE MAR Individual session::  Pt. Agreed to address RUE exercises with Pendulum, and AAROM to 60 dgrees of shoulder flexion.  Performed all movements for RUE within safety precautions.   Pt. Tolerated session with minimal complaints of pain. .  Left pt in wc with  call bell,phone within reach.     See FIM for current functional status  Therapy/Group: Individual Therapy  Lisa Roca 06/17/2014, 11:03 AM

## 2014-06-17 NOTE — Progress Notes (Signed)
Physical Therapy Session Note  Patient Details  Name: Gabriel Riley MRN: [id number] Date of Birth: [birth year]  Today's Date: 06/17/2014 PT Individual Time: 1100-1200 PT Individual Time Calculation (min): 60 min   Session 2 Time: 1430-1500 Time Calculation (min): 30  Short Term Goals: Week 1:  PT Short Term Goal 1 (Week 1): Pt will complete bed mobility with SBA and verbal cues, as needed for R UE NWB.  PT Short Term Goal 2 (Week 1): Pt will complete sit to stand with SBA and HW (or no AD).  PT Short Term Goal 3 (Week 1): Pt will ambulate 19' with HW (or no AD) req CGA.  PT Short Term Goal 4 (Week 1): Pt will self propel manual w/c x 100' with SBA.  PT Short Term Goal 5 (Week 1): Pt will transfer w/c to/from bed req SBA with HW (or no AD).   Skilled Therapeutic Interventions/Progress Updates:    Session 1: Pt received seated in w/c handed off from OT, agreeable to participate in therapy. Session focused on BLE strengthening, functional endurance, gait training. Pt propelled w/c 150' to rehab gym w/ overall SBA w/ LUE and BLE. Pt moved w/c<>mat table w/ hemi-walker w/ stand pivot technique and overall MinA w/ mod cueing for hand placement and sequencing. Supine on mat table pt completed 2x10 SLR, quad sets, and supine hip abduction w/ BLE w/ rest breaks between sets due to decreased endurance. Pt ambulated 20' x2 w/ hemi-walker w/ cueing for 3 point gait pattern (R foot, then walker, then L foot). Pt very limited by decreased standing tolerance and decreased muscular endurance in BLE. Pt was unable to ambulate to initial target and had to sit down early on mat table, reported feeling like his legs were giving out. Pt required max cueing throughout session to maintain NWB on RUE and to lock brakes on R side of wheelchair before standing. Anticipate pt may benefit from supervision at home at discharge. Will continue to discuss most appropriate discharge plan with treatment team.   Session  2: Pt received supine in bed, agreeable to participate in therapy. Session focused on BLE strengthening, transfers. Pt complete4d 2x10 bed level exercises ankle pumps, SAQ, heel slides, glute sets, SLR, supine hip abduction. Completed PROM to R shoulder within precautions (flexion to 60 degrees, abduction to 60 degrees), AROM for wrist and AAROM to elbow. Pt able to tolerate shoulder flexion to 50 degrees and abduction to 30 degrees grossly. Pt transferred bed>w/c w/ stand pivot technique w/ hemi-walker and MinA for walker management and to maintain upright posture. Pt left seated in w/c w/ all needs within reach.   Therapy Documentation Precautions:  Precautions Precautions: Fall, Cervical Precaution Comments: no pushing, pulling, lifting with RUE Required Braces or Orthoses: Sling Cervical Brace: Other (comment) (daughter has taken c-collar home - PT asked pt to have her bring it back. Pt reports surgeon said he no longer needs c-collar. ) Restrictions Weight Bearing Restrictions: Yes RUE Weight Bearing: Non weight bearing Pain: Pain Assessment Pain Score: 4   See FIM for current functional status  Therapy/Group: Individual Therapy  Rada Hay  Rada Hay, PT, DPT 06/17/2014, 7:47 AM
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
## 2014-06-18 ENCOUNTER — Inpatient Hospital Stay (HOSPITAL_COMMUNITY): Payer: Medicare Other

## 2014-06-18 ENCOUNTER — Inpatient Hospital Stay (HOSPITAL_COMMUNITY): Payer: No Typology Code available for payment source

## 2014-06-18 ENCOUNTER — Inpatient Hospital Stay (HOSPITAL_COMMUNITY): Payer: No Typology Code available for payment source | Admitting: Speech Pathology

## 2014-06-18 DIAGNOSIS — S069X2A Unspecified intracranial injury with loss of consciousness of 31 minutes to 59 minutes, initial encounter: Secondary | ICD-10-CM | POA: Diagnosis present

## 2014-06-18 LAB — GLUCOSE, CAPILLARY
GLUCOSE-CAPILLARY: 114 mg/dL — AB (ref 70–99)
GLUCOSE-CAPILLARY: 200 mg/dL — AB (ref 70–99)
Glucose-Capillary: 117 mg/dL — ABNORMAL HIGH (ref 70–99)
Glucose-Capillary: 152 mg/dL — ABNORMAL HIGH (ref 70–99)

## 2014-06-18 LAB — URINALYSIS, ROUTINE W REFLEX MICROSCOPIC
Bilirubin Urine: NEGATIVE
Glucose, UA: NEGATIVE mg/dL
Hgb urine dipstick: NEGATIVE
KETONES UR: NEGATIVE mg/dL
LEUKOCYTES UA: NEGATIVE
Nitrite: NEGATIVE
PROTEIN: 30 mg/dL — AB
Specific Gravity, Urine: 1.018 (ref 1.005–1.030)
Urobilinogen, UA: 0.2 mg/dL (ref 0.0–1.0)
pH: 6 (ref 5.0–8.0)

## 2014-06-18 LAB — URINE MICROSCOPIC-ADD ON

## 2014-06-18 NOTE — Progress Notes (Signed)
Speech Language Pathology Daily Session Note  Patient Details  Name: Gabriel Riley MRN: [id number] Date of Birth: 1940-08-14  Today's Date: 06/18/2014 SLP Individual Time: 1400-1430 SLP Individual Time Calculation (min): 30 min  Short Term Goals: Week 1: SLP Short Term Goal 1 (Week 1): Pt will improve semi-complex problem solving during functional tasks over 80% of observable opportunities with supervision cues  SLP Short Term Goal 2 (Week 1): Pt will improve delayed recall of information via compensatory strategies and external aids over 80% of observable opportunities with supervision cues SLP Short Term Goal 3 (Week 1): Pt will improve word finding of abstract, semi-complex concepts at the conversational level with Mod I  Skilled Therapeutic Interventions: Skilled treatment session focused on addressing cognitive-linguistic goals. SLP facilitated session with discussion regarding previously taught word-finding strategies and provided Min question cues to utilize throughout session.  SLP also facilitated session with new learning task and Min faded to Supervision cues to utilize working memory strategies for recall and use of procedures for the task.  Continue with current plan of care.   FIM:  Comprehension Comprehension Mode: Auditory Comprehension: 5-Follows basic conversation/direction: With extra time/assistive device Expression Expression Mode: Verbal Expression: 5-Expresses basic 90% of the time/requires cueing < 10% of the time. Social Interaction Social Interaction: 5-Interacts appropriately 90% of the time - Needs monitoring or encouragement for participation or interaction. Problem Solving Problem Solving: 4-Solves basic 75 - 89% of the time/requires cueing 10 - 24% of the time Memory Memory: 4-Recognizes or recalls 75 - 89% of the time/requires cueing 10 - 24% of the time  Pain Pain Assessment Pain Assessment: No/denies pain Pain Score:  Asleep  Therapy/Group: Individual Therapy  Carmelia Roller., CCC-SLP [phone]  [employer] 06/18/2014, 4:18 PM

## 2014-06-18 NOTE — Progress Notes (Signed)
Physical Therapy Session Note  Patient Details  Name: Gabriel Riley MRN: [id number] Date of Birth: 15-Aug-1940  Today's Date: 06/18/2014 PT Individual Time: 0900-1000 PT Individual Time Calculation (min): 60 min   Session 2 Time: 1856-3149 Time Calculation (min): 30  Short Term Goals: Week 1:  PT Short Term Goal 1 (Week 1): Pt will complete bed mobility with SBA and verbal cues, as needed for R UE NWB.  PT Short Term Goal 2 (Week 1): Pt will complete sit to stand with SBA and HW (or no AD).  PT Short Term Goal 3 (Week 1): Pt will ambulate 63' with HW (or no AD) req CGA.  PT Short Term Goal 4 (Week 1): Pt will self propel manual w/c x 100' with SBA.  PT Short Term Goal 5 (Week 1): Pt will transfer w/c to/from bed req SBA with HW (or no AD).   Skilled Therapeutic Interventions/Progress Updates:    Session 1: Pt received seated in w/c, agreeable to participate in therapy. Pt propelled w/c 150' to rehab gym w/ LUE/BLE. Pt reporting increased fatigue this AM after poor night of sleep and required increased rest breaks throughout session. Pt able to recall gait pattern w/ hemi-walker after education yesterday with no additional cueing. Ambulated 10' w/ hemi-walker For general cardiovascular endurance and BLE strengthening pt completed Nustep L2 5'x2. Pt very fatigued after Nustep requiring extended rest break. Gait training w/ LBQC to improve gait pattern, pt able to ambulate 20' x2 w/ LBQC and minA w/ 1 LOB requiring heavy Min to correct. Pt reported that quad cane felt similar to hemi-walker, but still felt more confident w/ HW. Will continue working on progressing pt to increased independence with gait. In room pt transferred w/c>bed w/ hemi-walker and MinA, repositioned self towards [facility] w/ trendelenburg function and MinA. Session ended w/ pt supine in bed w/ R arm supported by pillow and all needs within reach.   Session 2: Pt received supine in bed, agreeable to participate in therapy.  Session focused on gait training w/ LBQC, transfers. Pt moved supine>sit w/ MinA, then performed squat pivot transfer bed>w/c w/ overall MinA, pt achieved 80% of transfer then required assist to scoot hips back in chair. Pt propelled w/c 150' to/from rehab gym. Blocked practice gait 30' x2 then 21' x2 w/ LBQC and overall MinA, pt demonstrates crouched gait pattern with increased knee flexion bilaterally and occasional buckling on L knee. Pt has difficulty maintaining 3 point gait pattern and maintaining UE support with AD during stance phase on L, however this improved with practice. During two bouts of ambulation pt negotiated around cones and over hurdles to simulate home environment, pt required increased time to negotiate obstacles. Session ended in pt's room, where pt was left seated in w/c w/ daughter present and all needs within reach.    Therapy Documentation Precautions:  Precautions Precautions: Fall, Cervical Precaution Comments: no pushing, pulling, lifting with RUE Required Braces or Orthoses: Sling Cervical Brace: Other (comment) (daughter has taken c-collar home - PT asked pt to have her bring it back. Pt reports surgeon said he no longer needs c-collar. ) Restrictions Weight Bearing Restrictions: Yes RUE Weight Bearing: Non weight bearing Pain: Pain Assessment Pain Assessment: 0-10 Pain Score: 7   See FIM for current functional status  Therapy/Group: Individual Therapy  Rada Hay  Rada Hay, PT, DPT 06/18/2014, 7:47 AM
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
## 2014-06-18 NOTE — Progress Notes (Signed)
Occupational Therapy Session Note  Patient Details  Name: Gabriel Riley MRN: [id number] Date of Birth: [birth year]  Today's Date: 06/18/2014 OT Individual Time: 0728-0828 OT Individual Time Calculation (min): 60 min    Short Term Goals: Week 1:  OT Short Term Goal 1 (Week 1): Pt will demonstrate pendulum exercises for R UE in order to demonstrate knowledge of proper technique. OT Short Term Goal 2 (Week 1): Pt will perform UB dressing with Min A in order to increase I in self care. OT Short Term Goal 3 (Week 1): Pt will perform LB dressing with Min A in order to increase I in self care. OT Short Term Goal 4 (Week 1): Pt will perform toilet transfer with Min A in order to increase I in functional transfers.   Skilled Therapeutic Interventions/Progress Updates:  ADL-retraining with focus on improved activity tolerance, sit>stand, static standing balance, and adherence to RUE WB precautions during BADL.    Pt received supine in bed, with breakfast tray set on overbed table.   Pt was alerted to presence of breakfast and able to transfer from bed to w/c to complete self-feeding with setup assist to open containers.    Pt setup to use right hand for self-feeding but unable to sustain continued use to complete self-feeding this session.   Pt remained in w/c to bathe at sink and required repeated vc to inhibit weight-bearing through right elbow when sitting during self-feeding and bathing.    Pt declined use of shower d/t fatigue and completed upper body bathing seated in w/c with min assist to wash right UE.   Pt washed groin and upper legs standing at sink with mod assist to maintain dynamic standing balance.   Pt unable to wash buttocks while standing and also required max assist to dress lower body after rest break.   Pt groomed at sink and remained seated in w/c at end of session with call light and phone within reach.   Pt reports awareness of memory deficits and impaired vision as result from  his injury.    Pt also discussed possible transfer to SNF prior to return to his home d/t continued rapid fatigue during therapies along with his need for moderate assist with all BADL and iADL.     Therapy Documentation Precautions:  Precautions Precautions: Fall, Cervical Precaution Comments: no pushing, pulling, lifting with RUE Required Braces or Orthoses: Sling Cervical Brace: Other (comment) (daughter has taken c-collar home - PT asked pt to have her bring it back. Pt reports surgeon said he no longer needs c-collar. ) Restrictions Weight Bearing Restrictions: Yes RUE Weight Bearing: Non weight bearing  Pain: Pain Assessment Pain Score: 5   See FIM for current functional status  Therapy/Group: Individual Therapy  Jacquelyne Quarry 06/18/2014, 12:59 PM

## 2014-06-19 ENCOUNTER — Inpatient Hospital Stay (HOSPITAL_COMMUNITY): Payer: No Typology Code available for payment source

## 2014-06-19 ENCOUNTER — Inpatient Hospital Stay (HOSPITAL_COMMUNITY): Payer: Medicare Other

## 2014-06-19 DIAGNOSIS — S069X4S Unspecified intracranial injury with loss of consciousness of 6 hours to 24 hours, sequela: Secondary | ICD-10-CM

## 2014-06-19 LAB — GLUCOSE, CAPILLARY
GLUCOSE-CAPILLARY: 76 mg/dL (ref 70–99)
Glucose-Capillary: 97 mg/dL (ref 70–99)
Glucose-Capillary: 146 mg/dL — ABNORMAL HIGH (ref 70–99)
Glucose-Capillary: 126 mg/dL — ABNORMAL HIGH (ref 70–99)

## 2014-06-19 MED ORDER — TRAMADOL-ACETAMINOPHEN 37.5-325 MG PO TABS
2.0000 | ORAL_TABLET | Freq: Three times a day (TID) | ORAL | Status: DC
  Administered 2014-06-19 – 2014-06-27 (×33): 2 via ORAL
  Filled 2014-06-19 (×33): qty 2

## 2014-06-19 MED ORDER — METHOCARBAMOL 750 MG PO TABS
750.0000 mg | ORAL_TABLET | Freq: Four times a day (QID) | ORAL | Status: DC
  Administered 2014-06-19 – 2014-06-27 (×31): 750 mg via ORAL
  Filled 2014-06-19 (×43): qty 1
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
## 2014-06-19 NOTE — Progress Notes (Signed)
Physical Therapy Session Note  Patient Details  Name: Gabriel Riley MRN: [id number] Date of Birth: [birth year]  Today's Date: 06/19/2014 PT Individual Time: 1100-1200 PT Individual Time Calculation (min): 60 min  Session 2 Time: 1630-1730 Time Calculation (min): 60  Short Term Goals: Week 1:  PT Short Term Goal 1 (Week 1): Pt will complete bed mobility with SBA and verbal cues, as needed for R UE NWB.  PT Short Term Goal 2 (Week 1): Pt will complete sit to stand with SBA and HW (or no AD).  PT Short Term Goal 3 (Week 1): Pt will ambulate 15' with HW (or no AD) req CGA.  PT Short Term Goal 4 (Week 1): Pt will self propel manual w/c x 100' with SBA.  PT Short Term Goal 5 (Week 1): Pt will transfer w/c to/from bed req SBA with HW (or no AD).   Skilled Therapeutic Interventions/Progress Updates:    Session 1: Pt received supine in bed asleep, easily aroused and agreeable to participate in therapy. Session focused on functional endurance, sit<>stands, transfers, bed mobility. Pt transferred supine>sit w/ ModA from flat bed w/ use of rails, w/ assist to bring trunk up to sitting. Pt required ModA to come to standing from hospital bed, then performed stand pivot transfer w/ MinA for safety and [facility] management. For functional endurance pt propelled w/c 150' to/from rehab gym w/ overall SBA, pt fatigued after w/c propulsion. In rehab gym pt performed blocked practice sit<>stands from progressively lower mat 5x each w/ overall CGA from 26" and 24" height, MinA from 22" height and ModA from 21" height. Pt required extended rest breaks between each bout due to decreased endurance. Blocked practice for moving supine<>sit w/ log roll technique and cueing to move supine<>L sidelying<>sit, overall MinA. For coordination and dynamic standing balance completed x5 cone taps on BLE in standing, demonstrated increased difficulty tapping cone w/ R leg due to weakness in LLE. After pt propelled w/c back to room,  he reported need for bathroom. Pt transferred to toilet w/ grab bar and MinA. Session ended in pt's room, where pt was left seated on toilet w/ NT present.   Session 2: Pt received supine in bed, agreeable to participate in therapy. Session focused on BLE strengthening, functional endurance, emotional support. Pt transferred supine>sit w/ overall MinA and extra time w/ assist to bring trunk up to sitting, then transferred bed>w/c w/ LBQC stand pivot technique and Min Guard A w/ mod cueing for hand placement. Pt propelled w/c 150' to/from rehab gym w/ overall SBA. For BLE strength and endurance pt completed 6' then 8' on Nustep L3 w/ LE only, pt very fatigued and required extended rest break between and after bouts. While resting pt became emotionally labile regarding his medical situation, this therapist provided emotional support and encouraged pt to pursue hobby of stamp collecting while in hospital by having his children bring in catalogs/listings for him to look through in hospital room. After pt completed exercise he ambulated 37' w/ LBQC and Min A, no LOB noted, distance greatly limited by decreased endurance and upright tolerance. Session ended in pt's room, where pt was left seated in recliner w/ son and social worker present and all needs within reach    Therapy Documentation Precautions:  Precautions Precautions: Fall, Cervical Precaution Comments: no pushing, pulling, lifting with RUE Required Braces or Orthoses: Sling Cervical Brace: Other (comment) (daughter has taken c-collar home - PT asked pt to have her bring it back. Pt reports surgeon  said he no longer needs c-collar. ) Restrictions Weight Bearing Restrictions: Yes RUE Weight Bearing: Non weight bearing Pain: Pain Assessment Pain Score: 6   See FIM for current functional status  Therapy/Group: Individual Therapy  Rada Hay  Rada Hay, PT, DPT 06/19/2014, 7:43 AM

## 2014-06-19 NOTE — Progress Notes (Signed)
Occupational Therapy Session Note  Patient Details  Name: Gabriel Riley MRN: [id number] Date of Birth: [birth year]  Today's Date: 06/19/2014 OT Individual Time:7:29-8:29 16 Min   Short Term Goals: Week 1:  OT Short Term Goal 1 (Week 1): Pt will demonstrate pendulum exercises for R UE in order to demonstrate knowledge of proper technique. OT Short Term Goal 2 (Week 1): Pt will perform UB dressing with Min A in order to increase I in self care. OT Short Term Goal 3 (Week 1): Pt will perform LB dressing with Min A in order to increase I in self care. OT Short Term Goal 4 (Week 1): Pt will perform toilet transfer with Min A in order to increase I in functional transfers.   Skilled Therapeutic Interventions/Progress Updates: ADL-retraining at shower level with emphasis on improved transfers (SPT using quad-tipped cane), lower body bathing and dressing skills, and static standing balance.   Pt received supine in bed, required min assist to rise to sit at edge of bed using HOB elevated, bed rail, and manual facilitation to promote anterior weight shift.   Pt completed sit>stand with steadying assist to maintain balance and SPT to w/c with vc to maintain NWB through RUE while lowering to w/c.   Pt completed similar transfer although ambulating from bathroom doorway to tub bench with contact guard assist and use of grab bar beside bench.   Pt able to bathe upper body seated except for left arm d/t right arm weakness/pain.   Pt able to hold wash cloth in right hand to wring out water and avoided use of RUE to weight shift with close supervision and verbal cues for memory.   Pt returned to w/c after bathing and dressed at sink side with max assist to don pants and shoes and min assist to don shirt.    Pt remained in w/c while therapist performed PROM to right shoulder, limited to no more than 60 deg flexion and 45 deg abduction, supporting arm at elbow and wrist.   Pt tolerated PROM well with vc to  inhibit scapular elevation during PROM.    Pt remained in w/c at end of session with setup to cut food.     Therapy Documentation Precautions:  Precautions Precautions: Fall, Cervical Precaution Comments: no pushing, pulling, lifting with RUE Required Braces or Orthoses: Sling Cervical Brace: Other (comment) (daughter has taken c-collar home - PT asked pt to have her bring it back. Pt reports surgeon said he no longer needs c-collar. ) Restrictions Weight Bearing Restrictions: Yes RUE Weight Bearing: Non weight bearing  Pain: Pain Assessment Pain Assessment: 0-10 Pain Score: 7  Pain Type: Acute pain Pain Location: Shoulder Pain Orientation: Right Pain Descriptors / Indicators: Aching;Spasm Pain Frequency: Intermittent Pain Onset: On-going Patients Stated Pain Goal: 4 Pain Intervention(s): Medication (See eMAR)  See FIM for current functional status  Therapy/Group: Individual Therapy  [place] 06/19/2014, 2:54 PM

## 2014-06-19 NOTE — Plan of Care (Signed)
Problem: RH Balance Goal: LTG Patient will maintain dynamic standing balance (PT) LTG: Patient will maintain dynamic standing balance with assistance during mobility activities (PT)  Goal downgraded 12/29 due to lack of patient progress  Problem: RH Bed to Chair Transfers Goal: LTG Patient will perform bed/chair transfers w/assist (PT) LTG: Patient will perform bed/chair transfers with assistance, with/without cues (PT).  Goal downgraded 12/29 due to lack of patient progress  Problem: RH Furniture Transfers Goal: LTG Patient will perform furniture transfers w/assist (OT/PT LTG: Patient will perform furniture transfers with assistance (OT/PT).  Goal downgraded 12/29 due to lack of patient progress  Problem: RH Ambulation Goal: LTG Patient will ambulate in controlled environment (PT) LTG: Patient will ambulate in a controlled environment, # of feet with assistance (PT).  Distance goal downgraded 12/29 due to lack of patient progress  Problem: RH Stairs Goal: LTG Patient will ambulate up and down stairs w/assist (PT) LTG: Patient will ambulate up and down # of stairs with assistance (PT)  Outcome: Not Applicable Date Met:  20/60/15 D/C'ed goal 12/29 due to lack of patient progress, likely SNF placement
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
## 2014-06-19 NOTE — Progress Notes (Signed)
Social Work Patient ID: Gabriel Riley, male   DOB: 01-28-1941, 73 y.o.   MRN: [id number]   Met with pt and his son today to review team conference.  Both aware team with concerns about slow gains being made and recommendation that, if pt chooses to d/c home, then 24/7 will need to be provided in the home.  Discussed the option of SNF as a transition point to home.  Both are agreed to change d/c plan to SNF to allow more time with daily therapy.  Son very supportive and agrees that SNF "makes the most sense...".  Will begin placement process.  Catilyn Boggus, LCSW

## 2014-06-20 ENCOUNTER — Inpatient Hospital Stay (HOSPITAL_COMMUNITY): Payer: Medicare Other

## 2014-06-20 ENCOUNTER — Inpatient Hospital Stay (HOSPITAL_COMMUNITY): Payer: No Typology Code available for payment source

## 2014-06-20 ENCOUNTER — Inpatient Hospital Stay (HOSPITAL_COMMUNITY): Payer: No Typology Code available for payment source | Admitting: Physical Therapy

## 2014-06-20 DIAGNOSIS — S069X9S Unspecified intracranial injury with loss of consciousness of unspecified duration, sequela: Secondary | ICD-10-CM

## 2014-06-20 LAB — GLUCOSE, CAPILLARY
GLUCOSE-CAPILLARY: 141 mg/dL — AB (ref 70–99)
GLUCOSE-CAPILLARY: 132 mg/dL — AB (ref 70–99)
Glucose-Capillary: 74 mg/dL (ref 70–99)
Glucose-Capillary: 82 mg/dL (ref 70–99)
Glucose-Capillary: 117 mg/dL — ABNORMAL HIGH (ref 70–99)

## 2014-06-20 MED ORDER — GABAPENTIN 100 MG PO CAPS
100.0000 mg | ORAL_CAPSULE | Freq: Two times a day (BID) | ORAL | Status: DC
Start: 2014-06-20 — End: 2014-06-24
  Administered 2014-06-20 – 2014-06-23 (×8): 100 mg via ORAL
  Filled 2014-06-20 (×13): qty 1

## 2014-06-20 NOTE — Progress Notes (Signed)
Physical Therapy Weekly Progress Note  Patient Details  Name: Gabriel Riley MRN: [id number] Date of Birth: Sep 10, 1940  Beginning of progress report period: June 12, 2014 End of progress report period: June 20, 2014  Today's Date: 06/20/2014 PT Individual Time: 0830-0930 PT Individual Time Calculation (min): 60 min   Patient has met 4 of 5 short term goals.    Patient continues to demonstrate the following deficits: R UE dysfunction s/p surgery, impaired standing balance, difficulty with ambulation and transfers, low activity tolerance, generalized deconditioning and therefore will continue to benefit from skilled PT intervention to enhance overall performance with activity tolerance, balance, postural control, ability to compensate for deficits and coordination.  Patient progressing toward long term goals..  Continue plan of care. (Care plan had recently been changed with LTGs downgraded to grossly Supervision level due to pt's slow progress).   PT Short Term Goals Week 1:  PT Short Term Goal 1 (Week 1): Pt will complete bed mobility with SBA and verbal cues, as needed for R UE NWB.  PT Short Term Goal 1 - Progress (Week 1): Met PT Short Term Goal 2 (Week 1): Pt will complete sit to stand with SBA and HW (or no AD).  PT Short Term Goal 2 - Progress (Week 1): Met PT Short Term Goal 3 (Week 1): Pt will ambulate 22' with HW (or no AD) req CGA.  PT Short Term Goal 3 - Progress (Week 1): Met PT Short Term Goal 4 (Week 1): Pt will self propel manual w/c x 100' with SBA.  PT Short Term Goal 4 - Progress (Week 1): Met PT Short Term Goal 5 (Week 1): Pt will transfer w/c to/from bed req SBA with HW (or no AD).  PT Short Term Goal 5 - Progress (Week 1): Progressing toward goal Week 2:  PT Short Term Goal 1 (Week 2): STGs = LTGs due to ELOS  Skilled Therapeutic Interventions/Progress Updates:    Therapeutic Activity: Pt requests to brush teeth and PT provides set-up assist with  toothpaste on toothbrush and pt completes brushing teeth from a w/c level with set-up assist.  Pt requests to urinate and PT provides set-up assist to hand pt the urinal and pt is able to empty bladder with set-up assist from a w/c position.  PT instructs pt in sit to stand from w/c with [facility] and pt does so with SBA; PT instructs pt in w/c to bed stand-step transfer with [facility] and pt does so with SBA; PT instructs pt in sit to supine transfer and pt does so with SBA.  PT instructs pt in rolling L in bed req SBA (no bedrail), and L side lie to sit transfer req SBA without bedrail.  PT instructs pt in bed to w/c transfer with [facility] req verbal cues for sequencing and min A to achieve stand due to no armrest available to push off of.  Pt assisted back to bed at end of PT session for adequate pressure relief and rest before OT session. Bed alarm on.   Therapeutic Exercise: PT instructs pt in AROM to R UE: AROM fist pumps x 10, AROM forearm pronation/supination x 10, AROM wrist flexion/extension in neutral x 10, AAROM elbow flexion x 10, PROM shoulder flexion to 60 degrees x 10: x 3 sets.   Gait Training: PT instructs pt in ambulation with LBQC x 30' req CGA for safety and verbal cues to go slowly and safely - no LOB req assist to correct.   W/C  Management: PT instructs pt in w/c propulsion with B LEs x 150' req SBA in a controlled environment on level ground.  PT instructs pt in managing a ramp from a w/c level using backwards ascent with B LEs/L UE and forward descent with B LEs/L UE.   Pt has demonstrated progress towards independence with functional mobility, but continues to have balance deficits with upright mobility that put pt at risk of falling. Continue per PT POC.   Therapy Documentation Precautions:  Precautions Precautions: Fall, Cervical Precaution Comments: no pushing, pulling, lifting with RUE Required Braces or Orthoses: Sling Cervical Brace: Other (comment) (daughter has taken  c-collar home - PT asked pt to have her bring it back. Pt reports surgeon said he no longer needs c-collar. ) Restrictions Weight Bearing Restrictions: Yes RUE Weight Bearing: Non weight bearing LUE Weight Bearing: Weight bearing as tolerated RLE Weight Bearing: Weight bearing as tolerated LLE Weight Bearing: Weight bearing as tolerated Pain: Pain Assessment Pain Assessment: 0-10 Pain Score: 6  Pain Type: Surgical pain Pain Location: Shoulder Pain Orientation: Right Pain Descriptors / Indicators: Aching Pain Onset: On-going Pain Intervention(s): Emotional support;Rest Multiple Pain Sites: No  See FIM for current functional status  Therapy/Group: Individual Therapy  Nanetta Wiegman M 06/20/2014, 8:34 AM
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
## 2014-06-20 NOTE — Progress Notes (Signed)
Speech Language Pathology Weekly Progress and Session Note  Patient Details  Name: Gabriel Riley MRN: [id number] Date of Birth: November 25, 1940  Beginning of progress report period: June 13, 2014 End of progress report period: June 20, 2014  Today's Date: 06/20/2014 SLP Individual Time: 1300-1330 SLP Individual Time Calculation (min): 30 min  Short Term Goals: Week 1: SLP Short Term Goal 1 (Week 1): Pt will improve semi-complex problem solving during functional tasks over 80% of observable opportunities with supervision cues  SLP Short Term Goal 1 - Progress (Week 1): Progressing toward goal SLP Short Term Goal 2 (Week 1): Pt will improve delayed recall of information via compensatory strategies and external aids over 80% of observable opportunities with supervision cues SLP Short Term Goal 2 - Progress (Week 1): Progressing toward goal SLP Short Term Goal 3 (Week 1): Pt will improve word finding of abstract, semi-complex concepts at the conversational level with Mod I SLP Short Term Goal 3 - Progress (Week 1): Progressing toward goal    New Short Term Goals: Week 2: SLP Short Term Goal 1 (Week 2): Pt will improve semi-complex problem solving during functional tasks over 80% of observable opportunities with supervision cues  SLP Short Term Goal 2 (Week 2): Pt will improve delayed recall of information via compensatory strategies and external aids over 80% of observable opportunities with supervision cues SLP Short Term Goal 3 (Week 2): Pt will improve word finding of abstract, semi-complex concepts at the conversational level with Mod I  Weekly Progress Updates:  Pt made limited functional gains this reporting period and continues to require overall min assist to complete semi-complex cognitive-linguistic tasks.  Therefore, pt met 0 out of 3 short term goals and goals will be carried into next reporting period.  Pt's primary barriers to progress in therapy have been pt being  internally distracted during structured tasks.  Long term goals were downgraded to overall supervision on this date due to change in discharge plan to SNF.  Pt would continue to benefit from skilled ST while inpatient in order to maximize functional independence and reduce burden of care prior to discharge.  Pt and family education is ongoing.  SLP continues to anticipate that pt will need 24/7 supervision and potential SLP follow up at next level of care.     Intensity: Minumum of 1-2 x/day, 30 to 90 minutes Frequency: 3 out of 7 days Duration/Length of Stay: 7-10 days for ST only  Treatment/Interventions: Cognitive remediation/compensation;Cueing hierarchy;Environmental controls;Internal/external aids;Patient/family education   Daily Session  Skilled Therapeutic Interventions: Pt was seen for skilled ST targeting cognitive goals.  Upon arrival, pt was seated upright in wheelchair, awake, alert, and agreeable to participate in ST.   SLP facilitated the session with a functional task targeting recall of daily information, with pt requiring overall mod-max assist verbal cues to recall function of newly scheduled medications when named.  As a result, SLP provided pt with a list of currently scheduled medications to facilitate carryover of new information in between therapy sessions.  SLP attempted to target planning, organization,and error awareness for medication management via a pill box activity; however, pt exhibited difficulty manipulating task materials due to decreased fine motor control and tingling sensation in his fingers.  Pt with good insight into task difficulty and appropriate problem solving strategies to generate an alternative means (i.e. Have son or daughter assist him) to accomplish medication management for when he eventually returns home (as discharge plan has now changed to SNF).  Throughout the  session, pt benefited from min cues for redirection as he exhibited tangential speech  output which distracted him from structured therapeutic activities.  No complaints or evidence of word finding deficits noted during today's therapy session.  Continue per current plan of care.        FIM:  Comprehension Comprehension Mode: Auditory Comprehension: 5-Understands basic 90% of the time/requires cueing < 10% of the time Expression Expression Mode: Verbal Expression: 5-Expresses basic 90% of the time/requires cueing < 10% of the time. Social Interaction Social Interaction: 5-Interacts appropriately 90% of the time - Needs monitoring or encouragement for participation or interaction. Problem Solving Problem Solving: 4-Solves basic 75 - 89% of the time/requires cueing 10 - 24% of the time Memory Memory: 4-Recognizes or recalls 75 - 89% of the time/requires cueing 10 - 24% of the time  Pain Pain Assessment Pain Assessment: No/denies pain   Therapy/Group: Individual Therapy  Cedric Denison, Selinda Orion 06/20/2014, 4:03 PM

## 2014-06-20 NOTE — Progress Notes (Signed)
Occupational Therapy Session Note  Patient Details  Name: Gabriel Riley MRN: [id number] Date of Birth: 06/10/1941  Today's Date: 06/20/2014 OT Individual Time: 1100-1200 and 1330-1400 OT Individual Time Calculation (min): 60 min and 30 min     Short Term Goals: Week 1:  OT Short Term Goal 1 (Week 1): Pt will demonstrate pendulum exercises for R UE in order to demonstrate knowledge of proper technique. OT Short Term Goal 2 (Week 1): Pt will perform UB dressing with Min A in order to increase I in self care. OT Short Term Goal 3 (Week 1): Pt will perform LB dressing with Min A in order to increase I in self care. OT Short Term Goal 4 (Week 1): Pt will perform toilet transfer with Min A in order to increase I in functional transfers.   Skilled Therapeutic Interventions/Progress Updates:    Session 1: Pt seen for ADL retraining with focus on functional transfers, standing balance, and activity tolerance. Pt received supine in bed requesting to bathe at sink vs shower. Pt completed supine>sit at supervision level and SPT bed>w/c with quad cane and min assist with min cues for adherence to NWB precautions. Completed bathing at sit<>stand level with min assist for standing balance and cues for hand placement. Educated on compensatory technique to wash L axillary region with good carryover. Pt able to hold wash cloth in R hand, however limited ROM allowing him to wash only lower part of LUE. Pt fatigued quickly throughout session, requiring multiple rest breaks. At end of session, therapist provided PROM to RUE for shoulder flexion and abduction. Pt left sitting in w/c with all needs in reach.   Session 2: Pt seen for 1:1 OT session with focus on therapeutic exercise, functional mobility, safety awareness, and activity tolerance. Pt received sitting in w/c agreeable to therapy. Engaged in pendulum exercises to RUE with min cues for recall and pt tolerating well. Engaged in functional mobility  around room with use of quad cane and min assist as pt ambulated 10-15' 3x with rest breaks between each. Pt requested to return to bed at end of session. Completed sit>supine with min assist and min cues for adherence to NWB precaution. Pt left in bed with all needs in reach.   Therapy Documentation Precautions:  Precautions Precautions: Fall, Cervical Precaution Comments: no pushing, pulling, lifting with RUE Required Braces or Orthoses: Sling Cervical Brace: Other (comment) (daughter has taken c-collar home - PT asked pt to have her bring it back. Pt reports surgeon said he no longer needs c-collar. ) Restrictions Weight Bearing Restrictions: Yes RUE Weight Bearing: Non weight bearing LUE Weight Bearing: Weight bearing as tolerated RLE Weight Bearing: Weight bearing as tolerated LLE Weight Bearing: Weight bearing as tolerated General:   Vital Signs:   Pain: Pain Assessment Pain Assessment: 0-10 Pain Score: 4  (Reassessment) Pain Type: Surgical pain Pain Location: Shoulder Pain Orientation: Right Pain Descriptors / Indicators: Aching;Sore Pain Onset: On-going Pain Intervention(s): Medication (See eMAR) (scheduled) Multiple Pain Sites: No  See FIM for current functional status  Therapy/Group: Individual Therapy  Duayne Cal 06/20/2014, 12:04 PM

## 2014-06-20 NOTE — Plan of Care (Signed)
Problem: RH Problem Solving Goal: LTG Patient will demonstrate problem solving for (SLP) LTG: Patient will demonstrate problem solving for basic/complex daily situations with cues (SLP)  Downgraded 12/30 due to change in discharge plan to SNF  Problem: RH Memory Goal: LTG Patient will use memory compensatory aids to (SLP) LTG: Patient will use memory compensatory aids to recall biographical/new, daily complex information with cues (SLP)  Downgraded to supevision 12/30 due to change in discharge plan to SNF

## 2014-06-21 ENCOUNTER — Inpatient Hospital Stay (HOSPITAL_COMMUNITY): Payer: Medicare Other | Admitting: Physical Therapy

## 2014-06-21 ENCOUNTER — Inpatient Hospital Stay (HOSPITAL_COMMUNITY): Payer: No Typology Code available for payment source | Admitting: Physical Therapy

## 2014-06-21 ENCOUNTER — Inpatient Hospital Stay (HOSPITAL_COMMUNITY): Payer: No Typology Code available for payment source

## 2014-06-21 ENCOUNTER — Inpatient Hospital Stay (HOSPITAL_COMMUNITY): Payer: No Typology Code available for payment source | Admitting: Speech Pathology

## 2014-06-21 DIAGNOSIS — S069X3S Unspecified intracranial injury with loss of consciousness of 1 hour to 5 hours 59 minutes, sequela: Secondary | ICD-10-CM

## 2014-06-21 LAB — GLUCOSE, CAPILLARY
GLUCOSE-CAPILLARY: 87 mg/dL (ref 70–99)
GLUCOSE-CAPILLARY: 224 mg/dL — AB (ref 70–99)
GLUCOSE-CAPILLARY: 67 mg/dL — AB (ref 70–99)
GLUCOSE-CAPILLARY: 83 mg/dL (ref 70–99)
GLUCOSE-CAPILLARY: 96 mg/dL (ref 70–99)

## 2014-06-21 LAB — URINE CULTURE: Colony Count: 30000

## 2014-06-21 MED ORDER — SULFAMETHOXAZOLE-TRIMETHOPRIM 400-80 MG PO TABS
1.0000 | ORAL_TABLET | Freq: Two times a day (BID) | ORAL | Status: AC
  Administered 2014-06-21 – 2014-06-23 (×6): 1 via ORAL
  Filled 2014-06-21 (×6): qty 1

## 2014-06-21 NOTE — Progress Notes (Signed)
Speech Language Pathology Daily Session Note  Patient Details  Name: Gabriel Riley MRN: [id number] Date of Birth: [birth year]  Today's Date: 06/21/2014 SLP Individual Time: 1430-1500 SLP Individual Time Calculation (min): 30 min  Short Term Goals: Week 2: SLP Short Term Goal 1 (Week 2): Pt will improve semi-complex problem solving during functional tasks over 80% of observable opportunities with supervision cues  SLP Short Term Goal 2 (Week 2): Pt will improve delayed recall of information via compensatory strategies and external aids over 80% of observable opportunities with supervision cues SLP Short Term Goal 3 (Week 2): Pt will improve word finding of abstract, semi-complex concepts at the conversational level with Mod I  Skilled Therapeutic Interventions: Skilled treatment session focused on cognitive goals. SLP facilitated session by providing Min A semantic cues during a mildly complex, word-finding task. Patient recalled events from morning therapy sessions and current discharge plan with supervision multimodal cues. Patient also propelled himself to and from his room with extra time and Mod I. Patient left in wheelchair with all needs within reach. Continue with current plan of care.    FIM:  Comprehension Comprehension Mode: Auditory Comprehension: 5-Follows basic conversation/direction: With no assist Expression Expression Mode: Verbal Expression: 5-Expresses complex 90% of the time/cues < 10% of the time Social Interaction Social Interaction: 6-Interacts appropriately with others with medication or extra time (anti-anxiety, antidepressant). Problem Solving Problem Solving: 4-Solves basic 75 - 89% of the time/requires cueing 10 - 24% of the time Memory Memory: 4-Recognizes or recalls 75 - 89% of the time/requires cueing 10 - 24% of the time  Pain Pain Assessment Pain Assessment: 0-10 Pain Score: 7  Pain Type: Surgical pain Pain Location: Shoulder Pain  Orientation: Right Pain Descriptors / Indicators: Sore Pain Onset: On-going Pain Intervention(s): RN made aware Multiple Pain Sites: No  Therapy/Group: Individual Therapy  Sophiah Rolin, [place] 06/21/2014, 4:01 PM
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
## 2014-06-21 NOTE — Progress Notes (Signed)
Occupational Therapy Weekly Progress Note  Patient Details  Name: Gabriel Riley MRN: [id number] Date of Birth: 1940-08-23  Beginning of progress report period: June 13, 2014 End of progress report period: June 21, 2014  Today's Date: 06/21/2014 OT Individual Time:1000-1100 60 Minutes      Patient has met 2 of 4 short term goals.  Goals modified due to continued restricted use of RUE (dominant) d/t fx and chronic impairment at LUE limiting AROM at shoulder.  Patient continues to demonstrate the following deficits: Impaired endurance, impaired dynamic standing balance, pain and precautions with use of RUE (dominant), and residual cognitive impairment limiting new learning and therefore will continue to benefit from skilled OT intervention to enhance overall performance with BADL.  Patient progressing toward long term goals..  Plan of care revisions: (LTGs downgraded 06/19/14).  OT Short Term Goals Week 1:  OT Short Term Goal 1 (Week 1): Pt will demonstrate pendulum exercises for R UE in order to demonstrate knowledge of proper technique. OT Short Term Goal 1 - Progress (Week 1): Met OT Short Term Goal 2 (Week 1): Pt will perform UB dressing with Min A in order to increase I in self care. OT Short Term Goal 2 - Progress (Week 1): Partly met OT Short Term Goal 3 (Week 1): Pt will perform LB dressing with Min A in order to increase I in self care. OT Short Term Goal 3 - Progress (Week 1): Revised due to lack of progress OT Short Term Goal 4 (Week 1): Pt will perform toilet transfer with Min A in order to increase I in functional transfers.  OT Short Term Goal 4 - Progress (Week 1): Met Week 2:  OT Short Term Goal 1 (Week 2): Pt will perform LB dressing with Mod  A in order to increase I in self care. OT Short Term Goal 2 (Week 2): Pt will perform UB dressing with Min A in order to increase I in self care. OT Short Term Goal 3 (Week 2): Pt will complete toilet hygiene with  min assist for thoroughness OT Short Term Goal 4 (Week 2): Pt will complete functional transfers on/off toilet with supervision OT Short Term Goal 5 (Week 2): Pt will complete bathing sitting/standing with min assist  Skilled Therapeutic Interventions/Progress Updates: ADL-retraining at shower level with focus on improved independence with transfers, static/dynamic standing balance, functional mobility using RW, AE training (LH sponge, reacher, shoe button), and improved activity tolerance.   Pt received supine in bed and receptive for shower level BADL.   With min vc and use of quad cane, bed rails w/HOB elevated pt rose to sit at edge of bed unassisted by therapist this session.    Pt completed sit>stand and stand-pivot transfer to w/c with min vc to not lower with RUE.   Pt then ambulated from w/c placed at bathroom doorway to tub bench with min guard assist and bathed using LHE sponge to wash LE, setup assist for supplies and min assist to wash buttocks after standing supported at grab bar.   Pt required rest break 2 times during bathing and then required min assist to transfer back to w/c for dressing at sink.   Pt's endurance waned significantly after bathing and he required mod-max assist to dress with poor attention to pt ed on use of reacher and shoe buttons to enhance LB dressing skills.    Pt left in w/c at end of session and advised to recover seated in w/c before returning to  bed for rest.   Call light and phone placed within reach.     Therapy Documentation Precautions:  Precautions Precautions: Fall, Cervical Precaution Comments: no pushing, pulling, lifting with RUE Required Braces or Orthoses: Sling Cervical Brace: Other (comment) (daughter has taken c-collar home - PT asked pt to have her bring it back. Pt reports surgeon said he no longer needs c-collar. ) Restrictions Weight Bearing Restrictions: Yes RUE Weight Bearing: Non weight bearing LUE Weight Bearing: Weight bearing as  tolerated RLE Weight Bearing: Weight bearing as tolerated LLE Weight Bearing: Weight bearing as tolerated  Vital Signs: Therapy Vitals Temp: 98.5 F (36.9 C) Temp Source: Oral Pulse Rate: 74 Resp: 16 BP: (!) 124/94 mmHg Patient Position (if appropriate): Lying Oxygen Therapy SpO2: 93 % O2 Device: Not Delivered  Pain: 5/10,right UE   See FIM for current functional status  Therapy/Group: Individual Therapy  [place] 06/21/2014, 6:54 AM
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
## 2014-06-21 NOTE — Significant Event (Signed)
Hypoglycemic Event  CBG: 67  Treatment: 15 GM carbohydrate snack  Symptoms: None  Follow-up CBG: Time:1657 CBG Result:83  Possible Reasons for Event: Unknown  Comments/MD notified:Notified Pam Love, PA no new orders given. Will continue to monitor    Gabriel Riley, Gabriel Riley  Remember to initiate Hypoglycemia Order Set & complete

## 2014-06-21 NOTE — Progress Notes (Signed)
Physical Therapy Session Note  Patient Details  Name: Gabriel Riley MRN: [id number] Date of Birth: 1940-10-22  Today's Date: 06/21/2014 PT Individual Time: 1500-1530 PT Individual Time Calculation (min): 30 min   Short Term Goals: Week 2:  PT Short Term Goal 1 (Week 2): STGs = LTGs due to ELOS  Skilled Therapeutic Interventions/Progress Updates:    Pt received seated in w/c; initially requesting to return to bed but agreeable to participate in PT session with mod encouragement. Pt performed stand pivot transfer from w/c > bed with QC and min guard for stability/balance during pivoting. Remainder of session focused on blocked practice of graded sit<>stand from progressively lower bed height for functional LE strengthening. Pt required min-mod A, manual facilitation of anterior weight shift. Pt's sitting posture characterized by downward gaze, cervicothoracic flexion, scapular protraction, and posterior pelvic tilt. Therefore, multimodal cueing focused on postural awareness during transfer set up to  increase pt safety/independence with sit <>stand. Pt performed sit > supine with supervision with HOB flat, no rail. Departed with pt semi reclined in bed with 3 rails up, bed alarm on, and all needs within reach.  Therapy Documentation Precautions:  Precautions Precautions: Fall, Cervical Precaution Comments: no pushing, pulling, lifting with RUE Required Braces or Orthoses: Sling Cervical Brace: Other (comment) (daughter has taken c-collar home - PT asked pt to have her bring it back. Pt reports surgeon said he no longer needs c-collar. ) Restrictions Weight Bearing Restrictions: Yes RUE Weight Bearing: Non weight bearing LUE Weight Bearing: Weight bearing as tolerated RLE Weight Bearing: Weight bearing as tolerated LLE Weight Bearing: Weight bearing as tolerated Vital Signs: Therapy Vitals Pulse Rate: 89 Pain: Pain Assessment Pain Assessment: 0-10 Pain Score: 7  Pain Type:  Surgical pain Pain Location: Shoulder Pain Orientation: Right Pain Descriptors / Indicators: Sore Pain Onset: On-going Pain Intervention(s): RN made aware Multiple Pain Sites: No Locomotion : Ambulation Ambulation/Gait Assistance: 4: Min guard Wheelchair Mobility Distance: 250  Balance: Balance Balance Assessed: Yes Standardized Balance Assessment Standardized Balance Assessment: Timed Up and Go Test Timed Up and Go Test TUG: Normal TUG Normal TUG (seconds): 34  See FIM for current functional status  Therapy/Group: Individual Therapy  [name], Malva Cogan 06/21/2014, 3:17 PM

## 2014-06-22 DIAGNOSIS — A499 Bacterial infection, unspecified: Secondary | ICD-10-CM

## 2014-06-22 DIAGNOSIS — S069X2S Unspecified intracranial injury with loss of consciousness of 31 minutes to 59 minutes, sequela: Secondary | ICD-10-CM

## 2014-06-22 DIAGNOSIS — N390 Urinary tract infection, site not specified: Secondary | ICD-10-CM

## 2014-06-22 LAB — GLUCOSE, CAPILLARY
GLUCOSE-CAPILLARY: 74 mg/dL (ref 70–99)
GLUCOSE-CAPILLARY: 191 mg/dL — AB (ref 70–99)
GLUCOSE-CAPILLARY: 156 mg/dL — AB (ref 70–99)
Glucose-Capillary: 140 mg/dL — ABNORMAL HIGH (ref 70–99)
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
## 2014-06-23 ENCOUNTER — Inpatient Hospital Stay (HOSPITAL_COMMUNITY): Payer: No Typology Code available for payment source | Admitting: *Deleted

## 2014-06-23 ENCOUNTER — Inpatient Hospital Stay (HOSPITAL_COMMUNITY): Payer: No Typology Code available for payment source | Admitting: Physical Therapy

## 2014-06-23 ENCOUNTER — Inpatient Hospital Stay (HOSPITAL_COMMUNITY): Payer: No Typology Code available for payment source | Admitting: Occupational Therapy

## 2014-06-23 LAB — GLUCOSE, CAPILLARY
GLUCOSE-CAPILLARY: 77 mg/dL (ref 70–99)
GLUCOSE-CAPILLARY: 96 mg/dL (ref 70–99)
GLUCOSE-CAPILLARY: 123 mg/dL — AB (ref 70–99)
Glucose-Capillary: 176 mg/dL — ABNORMAL HIGH (ref 70–99)

## 2014-06-23 NOTE — Progress Notes (Signed)
Occupational Therapy Session Note  Patient Details  Name: Gabriel Riley MRN: [id number] Date of Birth: 07-18-1940  Today's Date: 06/23/2014 OT Individual Time: 0900-1000 OT Individual Time Calculation (min): 60 min    Skilled Therapeutic Interventions/Progress Updates: ADL in w/c at sink with c/os of right shoulder pain (did not rate his pain)but patient still initiated use R UE.   RN gave pain meds.   Focus was on initiating R UE use while staying within the no pushing, pulling or weight bearing precautions.    Patient required some R UE HOH assist to wash and dry his L arm pit (vs. His simply asking this clinician to do it for him).     Also focused on dynamic standing balance at sink such that patient was able to wash periarea and maintain balance for about 5 seconds.   Clinician finished helping him wash and dry.  He was able to pull up pants with min a to maintain balance while using his left UE and not bearing through R UE    Therapy Documentation Precautions:  Precautions Precautions: Fall, Cervical Precaution Comments: no pushing, pulling, lifting with RUE Required Braces or Orthoses: Sling Cervical Brace: Other (comment) (daughter has taken c-collar home - PT asked pt to have her bring it back. Pt reports surgeon said he no longer needs c-collar. ) Restrictions Weight Bearing Restrictions: Yes RUE Weight Bearing: Non weight bearing LUE Weight Bearing: Weight bearing as tolerated RLE Weight Bearing: Weight bearing as tolerated LLE Weight Bearing: Weight bearing as tolerated   Pain: c/o of constant R shoulder aching but he was not able to rate the level.   RN came in to offer scheduled meds.  See FIM for current functional status  Therapy/Group: Individual Therapy  Alfredia Ferguson [facility] 06/23/2014, 12:07 PM

## 2014-06-23 NOTE — Progress Notes (Signed)
Physical Therapy Session Note  Patient Details  Name: Gabriel Riley MRN: [id number] Date of Birth: 01-Oct-1940  Today's Date: 06/23/2014 PT Individual Time: 1000-1100 Treatment Session 2: 1430-1530 PT Individual Time Calculation (min): 60 min  Treatment Session 2: 60 min  Short Term Goals: Week 2:  PT Short Term Goal 1 (Week 2): STGs = LTGs due to ELOS  Skilled Therapeutic Interventions/Progress Updates:    Gait Training: PT instructs pt in ambulation with LBQC x 55' + 30' req CGA progressing to min A with fatigue - focus is on proper 3 point gait sequencing with quad cane req verbal cues and poor demonstration by pt.   W/C Management: PT demonstrates slow w/c propulsion with B LEs modified independent x 200' with B LEs and L UE on level surface in controlled environment.  PT instructs pt in ascending/descending w/c ramp req SBA and verbal cues to align w/c to go up/down straight - backwards ascent, forwards descent.   Therapeutic Exercise: PT instructs pt in R UE ROM exercises: fist pumps AROM, wrist flexion/extension AROM, forearm supination/pronation AROM, biceps flexion A/AROM, shoulder flexion PROM to 60 degrees, PROM arm abduction with shoulder in neutral to 45 degrees, pendulum exercise in sitting both directions: x 10 reps each.   Therapeutic Activity: PT instructs pt in car transfer, req SBA-CGA for transfer into the car and req min A on transfer out of the low car x 2 reps.   Treatment Session 2: Therapeutic Exercise: PT instructs pt in bed level exercises for ROM and strengthening: SLR, : supine hip abduction/adduction, heel slides, hip ER/IR in B hook lie, bridges, partial sit-ups, B knees side to side in hook lie for lumbar rotation, and ankle rolls each direction: 3 x 10 reps each  Pt participates in bed exercises well in the PM. Ambulation is making progress, but once pt becomes fatigued he req significant rest breaks to continue with functional mobility. Pt  continues to have difficulty with car transfers and will benefit from continued practice doing these. Continue per PT POC.   Therapy Documentation Precautions:  Precautions Precautions: Fall, Cervical Precaution Comments: no pushing, pulling, lifting with RUE Required Braces or Orthoses: Sling Cervical Brace: Other (comment) (daughter has taken c-collar home - PT asked pt to have her bring it back. Pt reports surgeon said he no longer needs c-collar. ) Restrictions Weight Bearing Restrictions: Yes RUE Weight Bearing: Non weight bearing LUE Weight Bearing: Weight bearing as tolerated RLE Weight Bearing: Weight bearing as tolerated LLE Weight Bearing: Weight bearing as tolerated Pain: Pain Assessment Pain Assessment: 0-10 Pain Score: 7  Pain Type: Surgical pain Pain Location: Arm Pain Orientation: Right Pain Descriptors / Indicators: Sore Pain Onset: On-going Pain Intervention(s): Rest Multiple Pain Sites: No Treatment Session 2: Pt c/o 4/10 R arm pain and PT uses rest and repositioning to decrease pain.   See FIM for current functional status  Therapy/Group: Individual Therapy  Lorina Duffner M 06/23/2014, 10:06 AM
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
## 2014-06-24 ENCOUNTER — Inpatient Hospital Stay (HOSPITAL_COMMUNITY): Payer: No Typology Code available for payment source | Admitting: Occupational Therapy

## 2014-06-24 LAB — GLUCOSE, CAPILLARY
GLUCOSE-CAPILLARY: 66 mg/dL — AB (ref 70–99)
GLUCOSE-CAPILLARY: 177 mg/dL — AB (ref 70–99)
Glucose-Capillary: 66 mg/dL — ABNORMAL LOW (ref 70–99)
Glucose-Capillary: 95 mg/dL (ref 70–99)
Glucose-Capillary: 166 mg/dL — ABNORMAL HIGH (ref 70–99)
Glucose-Capillary: 111 mg/dL — ABNORMAL HIGH (ref 70–99)

## 2014-06-24 MED ORDER — GABAPENTIN 100 MG PO CAPS: 200.0000 mg | ORAL_CAPSULE | Freq: Two times a day (BID) | ORAL | Status: DC

## 2014-06-24 MED ORDER — GABAPENTIN 100 MG PO CAPS
200.0000 mg | ORAL_CAPSULE | Freq: Two times a day (BID) | ORAL | Status: DC
  Administered 2014-06-24 – 2014-06-27 (×7): 200 mg via ORAL
  Filled 2014-06-24 (×9): qty 2

## 2014-06-24 MED ORDER — GABAPENTIN 100 MG PO CAPS: 200.0000 mg | ORAL_CAPSULE | Freq: Three times a day (TID) | ORAL | Status: DC
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
## 2014-06-24 NOTE — Significant Event (Signed)
Hypoglycemic Event  CBG: 66  Treatment: 15 GM carbohydrate snack  Symptoms: None  Follow-up CBG: Time:0729 CBG Result:95  Possible Reasons for Event: Unknown  Comments/MD notified:    Charlann Noss  Remember to initiate Hypoglycemia Order Set & complete

## 2014-06-25 ENCOUNTER — Inpatient Hospital Stay (HOSPITAL_COMMUNITY): Payer: No Typology Code available for payment source | Admitting: Speech Pathology

## 2014-06-25 ENCOUNTER — Inpatient Hospital Stay (HOSPITAL_COMMUNITY): Payer: No Typology Code available for payment source

## 2014-06-25 LAB — GLUCOSE, CAPILLARY
GLUCOSE-CAPILLARY: 81 mg/dL (ref 70–99)
GLUCOSE-CAPILLARY: 127 mg/dL — AB (ref 70–99)
Glucose-Capillary: 128 mg/dL — ABNORMAL HIGH (ref 70–99)
Glucose-Capillary: 121 mg/dL — ABNORMAL HIGH (ref 70–99)

## 2014-06-25 MED ORDER — DABIGATRAN ETEXILATE MESYLATE 75 MG PO CAPS
75.0000 mg | ORAL_CAPSULE | Freq: Two times a day (BID) | ORAL | Status: DC
  Administered 2014-06-25 – 2014-06-27 (×5): 75 mg via ORAL
  Filled 2014-06-25 (×7): qty 1

## 2014-06-25 MED ORDER — GLIMEPIRIDE 4 MG PO TABS
4.0000 mg | ORAL_TABLET | Freq: Every day | ORAL | Status: DC
  Administered 2014-06-26 – 2014-06-27 (×2): 4 mg via ORAL
  Filled 2014-06-25 (×3): qty 1
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
## 2014-06-25 NOTE — Progress Notes (Signed)
Speech Language Pathology Daily Session Note  Patient Details  Name: Gabriel Riley MRN: [id number] Date of Birth: [birth year]  Today's Date: 06/25/2014 SLP Individual Time: 5093-2671 SLP Individual Time Calculation (min): 50 min  Short Term Goals: Week 2: SLP Short Term Goal 1 (Week 2): Pt will improve semi-complex problem solving during functional tasks over 80% of observable opportunities with supervision cues  SLP Short Term Goal 2 (Week 2): Pt will improve delayed recall of information via compensatory strategies and external aids over 80% of observable opportunities with supervision cues SLP Short Term Goal 3 (Week 2): Pt will improve word finding of abstract, semi-complex concepts at the conversational level with Mod I  Skilled Therapeutic Interventions: Skilled treatment session focused on cognitive-linguistic goals. Upon arrival, patient was sitting upright in the wheelchair and was agreeable to participate in treatment session. SLP facilitated session by providing supervision question cues for recall of events from previous therapy sessions and for goals of skilled intervention.  Patient participated in a basic money management task with Mod I for problem solving and required supervision question cues for recall of rules to a previously learned task as well as for problem solving with the mildly complex task.  Patient was also Mod I for anticipatory awareness in regards to d/c planning and for word-finding during a functional conversation. Patient left in wheelchair with all needs within reach. Continue with current plan of care.    FIM:  Comprehension Comprehension Mode: Auditory Comprehension: 5-Understands complex 90% of the time/Cues < 10% of the time Expression Expression Mode: Verbal Expression: 5-Expresses complex 90% of the time/cues < 10% of the time Social Interaction Social Interaction: 6-Interacts appropriately with others with medication or extra time  (anti-anxiety, antidepressant). Problem Solving Problem Solving: 5-Solves basic 90% of the time/requires cueing < 10% of the time Memory Memory: 5-Recognizes or recalls 90% of the time/requires cueing < 10% of the time FIM - Eating Eating Activity: 5: Set-up assist for cut food  Pain Pain Assessment Pain Assessment: 0-10 Pain Score: 6  Pain Type: Acute pain Pain Location: Shoulder Pain Orientation: Right Pain Descriptors / Indicators: Aching Pain Frequency: Constant Pain Onset: On-going Patients Stated Pain Goal: 3 Pain Intervention(s): Medication (See eMAR);Repositioned Multiple Pain Sites: No  Therapy/Group: Individual Therapy  Gabriel Riley 06/25/2014, 10:12 AM

## 2014-06-25 NOTE — Progress Notes (Signed)
Physical Therapy Session Note  Patient Details  Name: Gabriel Riley MRN: [id number] Date of Birth: [birth year]  Today's Date: 06/25/2014 PT Individual Time: 1015-1130 PT Individual Time Calculation (min): 75 min   Short Term Goals: Week 2:  PT Short Term Goal 1 (Week 2): STGs = LTGs due to ELOS  Skilled Therapeutic Interventions/Progress Updates:    Pt received seated in w/c, agreeable to participate in therapy. Session focused on functional endurance, BLE strengthening, gait training/ Pt propelled w/c w/ BLE and intermittent use of LUE in hospital hallway and carpeted surface around obstacles up to 150' at a time with intermittent rest breaks due to decreased endurance. Pt moved supine<>sit w/ overall MinA, performed supine therapeutic exercise for BLE and core strengthening with frequent and extended rest breaks due to decreased endurance. Pt ambulated 30' w/ LBQC and overall Min Guard w/ focus on maintaining 3 point gait pattern so that pt had adequate UE support during L stance phase. After initial cueing pt maintained correct pattern with intermittent need to stop and correct especially after turns. Pt propelled w/c back to room from ortho gym with one rest break w/ BLE and intermittent use of LUE. Session ended in pt's room, where pt was left seated in w/c w/ all needs within reach.   Therapy Documentation Precautions:  Precautions Precautions: Fall, Cervical Precaution Comments: no pushing, pulling, lifting with RUE Required Braces or Orthoses: Sling Cervical Brace: Other (comment) (daughter has taken c-collar home - PT asked pt to have her bring it back. Pt reports surgeon said he no longer needs c-collar. ) Restrictions Weight Bearing Restrictions: Yes RUE Weight Bearing: Non weight bearing LUE Weight Bearing: Weight bearing as tolerated RLE Weight Bearing: Weight bearing as tolerated LLE Weight Bearing: Weight bearing as tolerated Pain:   Soreness in R shoulder, pt  repositioned in chair  See FIM for current functional status  Therapy/Group: Individual Therapy  Rada Hay  Rada Hay, PT, DPT 06/25/2014, 7:46 AM

## 2014-06-25 NOTE — Progress Notes (Signed)
Occupational Therapy Session Note  Patient Details  Name: Gabriel Riley MRN: [id number] Date of Birth: 27-Nov-1940  Today's Date: 06/25/2014 OT Individual Time: 0729-0829 OT Individual Time Calculation (min): 60 min    Short Term Goals: Week 2:  OT Short Term Goal 1 (Week 2): Pt will perform LB dressing with Mod  A in order to increase I in self care. OT Short Term Goal 2 (Week 2): Pt will perform UB dressing with Min A in order to increase I in self care. OT Short Term Goal 3 (Week 2): Pt will complete toilet hygiene with min assist for thoroughness OT Short Term Goal 4 (Week 2): Pt will complete functional transfers on/off toilet with supervision OT Short Term Goal 5 (Week 2): Pt will complete bathing sitting/standing with min assist  Skilled Therapeutic Interventions/Progress Updates: ADL-retraining with focus on improved activity tolerance, lower body bathing/dressing using AE, and improved transfers.  Pt received supine in bed, alert and awaiting therapist.  Pt reported mod pain at right shoulder this session and required min assist to rise from supine to edge of bed and mod assist to complete stand-pivot transfer from edge of bed to w/c.   Pt performed stand-pivot transfer to tub bench in walk-in shower and was able to bathe entire body this session except for buttocks d/t inability to lean to his right or use his right arm to wash while leaning to his left.   Pt required vc to use AE provided (reacher, LH sponge) and although attempting to use device is unable to problem-solve effectively for efficient use of AE.   Pt fatigued after bathing and required 2 additional rest breaks during assisted dressing although he was able to lace pants up to his knees this session.   Pt demo'd improved awareness of NWB precaution at RUE with only one vc required to maintain compliance.   Pt left in w/c at sink, grooming, with all needs within reach.      Therapy Documentation Precautions:   Precautions Precautions: Fall, Cervical Precaution Comments: no pushing, pulling, lifting with RUE Required Braces or Orthoses: Sling Cervical Brace: Other (comment) (daughter has taken c-collar home - PT asked pt to have her bring it back. Pt reports surgeon said he no longer needs c-collar. ) Restrictions Weight Bearing Restrictions: Yes RUE Weight Bearing: Non weight bearing LUE Weight Bearing: Weight bearing as tolerated RLE Weight Bearing: Weight bearing as tolerated LLE Weight Bearing: Weight bearing as tolerated  Vital Signs: Therapy Vitals Temp: 97.9 F (36.6 C) Temp Source: Oral Pulse Rate: 68 Resp: 16 BP: 137/86 mmHg Patient Position (if appropriate): Lying Oxygen Therapy SpO2: 95 % O2 Device: Not Delivered   Pain: Pain Assessment Pain Assessment: 0-10 Pain Score: 6  Pain Type: Acute pain Pain Location: Shoulder Pain Orientation: Right Pain Descriptors / Indicators: Aching Pain Frequency: Constant Pain Onset: With Activity Pain Intervention(s): Medication (See eMAR);Repositioned;Shower;Rest  See FIM for current functional status  Therapy/Group: Individual Therapy  Jermarion Poffenberger 06/25/2014, 8:51 AM

## 2014-06-26 ENCOUNTER — Inpatient Hospital Stay (HOSPITAL_COMMUNITY): Payer: No Typology Code available for payment source

## 2014-06-26 LAB — GLUCOSE, CAPILLARY
GLUCOSE-CAPILLARY: 141 mg/dL — AB (ref 70–99)
GLUCOSE-CAPILLARY: 164 mg/dL — AB (ref 70–99)
Glucose-Capillary: 78 mg/dL (ref 70–99)
Glucose-Capillary: 151 mg/dL — ABNORMAL HIGH (ref 70–99)

## 2014-06-26 NOTE — Progress Notes (Signed)
Occupational Therapy Session Note  Patient Details  Name: CHARVEZ VOORHIES MRN: [id number] Date of Birth: January 11, 1941  Today's Date: 06/26/2014 OT Individual Time: 0729-0829 OT Individual Time Calculation (min): 60 min    Short Term Goals: Week 2:  OT Short Term Goal 1 (Week 2): Pt will perform LB dressing with Mod  A in order to increase I in self care. OT Short Term Goal 2 (Week 2): Pt will perform UB dressing with Min A in order to increase I in self care. OT Short Term Goal 3 (Week 2): Pt will complete toilet hygiene with min assist for thoroughness OT Short Term Goal 4 (Week 2): Pt will complete functional transfers on/off toilet with supervision OT Short Term Goal 5 (Week 2): Pt will complete bathing sitting/standing with min assist  Skilled Therapeutic Interventions/Progress Updates: ADL-retraining with focus on improved use of AE during lower body B & D, endurance, dynamic standing balance, and activity tolerance.   Pt received supine in bed awaiting breakfast and assist with BADL.   Pt recovered to edge of bed unassisted using HOB elevated and bed rail.   Pt rose to stand and completed transfer to w/c w/o using of cane, provided by therapist.    Pt elected to bathe at sink this session but continues to report symptoms of fatigue, requiring rest breaks frequently during session.    Pt bathed with setup and min assist to wash LUE, rejecting use of LH sponge to improve independence.   Pt required mod assist to wash feet and buttocks but was able to stand at sink to wash peri-area with steadying assist to maintain balance.   Pt required mod assist to don shirt, rejecting suggestion to wear buttoned shirt as option to wearing tighter pull-over T-shirt that he cannot don by himself.   Pt rested and groomed at sink with min assist for thoroughness to shave and and comb his hair at back of his head.    Pt required setup to cut his food and was left to self-feed at end of session with call light,  reacher, and phone within reach.    Therapy Documentation Precautions:  Precautions Precautions: Fall, Cervical Precaution Comments: no pushing, pulling, lifting with RUE Required Braces or Orthoses: Sling Cervical Brace: Other (comment) (daughter has taken c-collar home - PT asked pt to have her bring it back. Pt reports surgeon said he no longer needs c-collar. ) Restrictions Weight Bearing Restrictions: Yes RUE Weight Bearing: Non weight bearing LUE Weight Bearing: Weight bearing as tolerated RLE Weight Bearing: Weight bearing as tolerated LLE Weight Bearing: Weight bearing as tolerated  Vital Signs: Therapy Vitals Temp: 97.6 F (36.4 C) Temp Source: Oral Pulse Rate: 66 Resp: 18 BP: 137/78 mmHg Patient Position (if appropriate): Lying Oxygen Therapy SpO2: 94 % O2 Device: Not Delivered   Pain: Pain Assessment Pain Assessment: 0-10 Pain Score: 6  Pain Type: Acute pain Pain Location: Arm Pain Orientation: Right Pain Descriptors / Indicators: Aching Pain Frequency: Intermittent Pain Onset: With Activity Pain Intervention(s): Medication (See eMAR);Repositioned;Distraction  See FIM for current functional status  Therapy/Group: Individual Therapy  Jazzlene Huot 06/26/2014, 8:29 AM
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
## 2014-06-26 NOTE — Progress Notes (Signed)
Occupational Therapy Session Note  Patient Details  Name: Gabriel Riley MRN: [id number] Date of Birth: Feb 24, 1941  Today's Date: 06/26/2014 OT Individual Time: 1300-1400 OT Individual Time Calculation (min): 60 min    Short Term Goals: Week 2:  OT Short Term Goal 1 (Week 2): Pt will perform LB dressing with Mod  A in order to increase I in self care. OT Short Term Goal 2 (Week 2): Pt will perform UB dressing with Min A in order to increase I in self care. OT Short Term Goal 3 (Week 2): Pt will complete toilet hygiene with min assist for thoroughness OT Short Term Goal 4 (Week 2): Pt will complete functional transfers on/off toilet with supervision OT Short Term Goal 5 (Week 2): Pt will complete bathing sitting/standing with min assist  Skilled Therapeutic Interventions/Progress Updates:    Engaged in therapeutic activity with focus on sit <> stand and standing tolerance.  Pt received seated upright in recliner, pt able to recall NWB in RUE.  Min assist stand pivot transfer to w/c.  In Day Room engaged in therapeutic activity in standing at high low table with focus on standing balance and standing tolerance during table top activity.  Pt required multiple sitting rest breaks secondary to Lt knee pain and SOB.  In standing pt tends to bear most weight through RLE and bias weight to Rt.  Set up activity to promote weight shifting to Lt.  Pt completed stand pivot transfer to bed at end of session and positioned in bed with RUE elevated on pillows.  Therapy Documentation Precautions:  Precautions Precautions: Fall, Cervical Precaution Comments: no pushing, pulling, lifting with RUE Required Braces or Orthoses: Sling Cervical Brace: Other (comment) (daughter has taken c-collar home - PT asked pt to have her bring it back. Pt reports surgeon said he no longer needs c-collar. ) Restrictions Weight Bearing Restrictions: Yes RUE Weight Bearing: Non weight bearing LUE Weight Bearing:  Weight bearing as tolerated RLE Weight Bearing: Weight bearing as tolerated LLE Weight Bearing: Weight bearing as tolerated General:   Vital Signs: Therapy Vitals Temp: 97.5 F (36.4 C) Temp Source: Oral Pulse Rate: 90 Resp: 17 BP: 119/84 mmHg Patient Position (if appropriate): Sitting Oxygen Therapy SpO2: 98 % O2 Device: Not Delivered Pain: Pain Assessment Pain Assessment: 0-10 Pain Score: 0-No pain Pain Type: Acute pain Pain Location: Shoulder Pain Orientation: Right Pain Intervention(s): Medication (See eMAR)  See FIM for current functional status  Therapy/Group: Individual Therapy  Simonne Come 06/26/2014, 3:10 PM

## 2014-06-26 NOTE — Progress Notes (Signed)
Physical Therapy Session Note  Patient Details  Name: Gabriel Riley MRN: [id number] Date of Birth: Nov 17, 1940  Today's Date: 06/26/2014 PT Individual Time: 1005-1110 PT Individual Time Calculation (min): 65 min   Short Term Goals: Week 1:  PT Short Term Goal 1 (Week 1): Pt will complete bed mobility with SBA and verbal cues, as needed for R UE NWB.  PT Short Term Goal 1 - Progress (Week 1): Met PT Short Term Goal 2 (Week 1): Pt will complete sit to stand with SBA and HW (or no AD).  PT Short Term Goal 2 - Progress (Week 1): Met PT Short Term Goal 3 (Week 1): Pt will ambulate 11' with HW (or no AD) req CGA.  PT Short Term Goal 3 - Progress (Week 1): Met PT Short Term Goal 4 (Week 1): Pt will self propel manual w/c x 100' with SBA.  PT Short Term Goal 4 - Progress (Week 1): Met PT Short Term Goal 5 (Week 1): Pt will transfer w/c to/from bed req SBA with HW (or no AD).  PT Short Term Goal 5 - Progress (Week 1): Progressing toward goal Week 2:  PT Short Term Goal 1 (Week 2): STGs = LTGs due to ELOS  Skilled Therapeutic Interventions/Progress Updates:   Pt received in bed; pt reporting pain in shoulder secondary to being unsupported in bed.  Repositioned UE in sling and pt transferred supine > sit and stand pivot to w/c with quad cane with min A.  Pt transported to gym in w/c total A and transferred to Nustep stand pivot with quad cane and min A.  Performed LE strengthening and endurance training on Nustep at level 6 x 8 minutes with one rest break to assess HR, Sp02 and perform deep breathing.  Performed RUE PROM and AROM in sitting with focus on stretching R elbow into extension, shoulder extension to neutral and ER as well as increasing thoracic extension and anterior pelvic tilt for improved trunk positioning for shoulder/scapular position.  Performed standing balance, endurance and strengthening exercises with LUE support on tall table during 10 reps each: heel raises, toe raises, hip  flexion marching with LLE (unable to perform single limb stance on LLE due to knee OA pain) and 10 reps alternating R and L lateral stepping for hip ABD and weight shifting.  Pt required multiple sitting rest breaks secondary to L knee pain and SOB.  Returned to room and transferred to recliner stand pivot with quad cane and min A.  Pt left with all items within reach.          Therapy Documentation Precautions:  Precautions Precautions: Fall, Cervical Precaution Comments: no pushing, pulling, lifting with RUE Required Braces or Orthoses: Sling Cervical Brace: Other (comment) (daughter has taken c-collar home - PT asked pt to have her bring it back. Pt reports surgeon said he no longer needs c-collar. ) Restrictions Weight Bearing Restrictions: Yes RUE Weight Bearing: Non weight bearing LUE Weight Bearing: Weight bearing as tolerated RLE Weight Bearing: Weight bearing as tolerated LLE Weight Bearing: Weight bearing as tolerated Vital Signs: Therapy Vitals Pulse Rate: 96 Oxygen Therapy SpO2: 96 % O2 Device: Not Delivered Pulse Oximetry Type: Intermittent Pain: Pain Assessment Pain Assessment: 0-10 Pain Score: 7  Pain Type: Acute pain Pain Location: Shoulder Pain Orientation: Right Pain Descriptors / Indicators: Sore Pain Onset: On-going Pain Intervention(s): Medication (See eMAR) Locomotion : Ambulation Ambulation/Gait Assistance: 4: Min guard Wheelchair Mobility Distance: 150   See FIM for current functional  status  Therapy/Group: Individual Therapy  Raylene Everts [facility] 06/26/2014, 12:46 PM

## 2014-06-27 ENCOUNTER — Encounter (HOSPITAL_COMMUNITY): Payer: Medicare Other

## 2014-06-27 ENCOUNTER — Inpatient Hospital Stay (HOSPITAL_COMMUNITY): Payer: Medicare Other | Admitting: Speech Pathology

## 2014-06-27 ENCOUNTER — Inpatient Hospital Stay (HOSPITAL_COMMUNITY): Payer: No Typology Code available for payment source

## 2014-06-27 ENCOUNTER — Inpatient Hospital Stay (HOSPITAL_COMMUNITY): Payer: Medicare Other | Admitting: Physical Therapy

## 2014-06-27 DIAGNOSIS — E11649 Type 2 diabetes mellitus with hypoglycemia without coma: Secondary | ICD-10-CM | POA: Diagnosis not present

## 2014-06-27 DIAGNOSIS — S140XXD Concussion and edema of cervical spinal cord, subsequent encounter: Secondary | ICD-10-CM | POA: Diagnosis not present

## 2014-06-27 DIAGNOSIS — D62 Acute posthemorrhagic anemia: Secondary | ICD-10-CM | POA: Diagnosis not present

## 2014-06-27 DIAGNOSIS — M792 Neuralgia and neuritis, unspecified: Secondary | ICD-10-CM

## 2014-06-27 DIAGNOSIS — Z6826 Body mass index (BMI) 26.0-26.9, adult: Secondary | ICD-10-CM | POA: Diagnosis not present

## 2014-06-27 DIAGNOSIS — S069X2S Unspecified intracranial injury with loss of consciousness of 31 minutes to 59 minutes, sequela: Secondary | ICD-10-CM | POA: Diagnosis not present

## 2014-06-27 DIAGNOSIS — M542 Cervicalgia: Secondary | ICD-10-CM | POA: Diagnosis not present

## 2014-06-27 DIAGNOSIS — S42301A Unspecified fracture of shaft of humerus, right arm, initial encounter for closed fracture: Secondary | ICD-10-CM | POA: Diagnosis not present

## 2014-06-27 DIAGNOSIS — N390 Urinary tract infection, site not specified: Secondary | ICD-10-CM | POA: Diagnosis not present

## 2014-06-27 DIAGNOSIS — E11351 Type 2 diabetes mellitus with proliferative diabetic retinopathy with macular edema: Secondary | ICD-10-CM | POA: Diagnosis not present

## 2014-06-27 DIAGNOSIS — I10 Essential (primary) hypertension: Secondary | ICD-10-CM | POA: Diagnosis not present

## 2014-06-27 DIAGNOSIS — I4891 Unspecified atrial fibrillation: Secondary | ICD-10-CM | POA: Diagnosis not present

## 2014-06-27 DIAGNOSIS — S42201D Unspecified fracture of upper end of right humerus, subsequent encounter for fracture with routine healing: Secondary | ICD-10-CM | POA: Diagnosis not present

## 2014-06-27 DIAGNOSIS — R0602 Shortness of breath: Secondary | ICD-10-CM | POA: Diagnosis not present

## 2014-06-27 DIAGNOSIS — S12501D Unspecified nondisplaced fracture of sixth cervical vertebra, subsequent encounter for fracture with routine healing: Secondary | ICD-10-CM | POA: Diagnosis not present

## 2014-06-27 DIAGNOSIS — I119 Hypertensive heart disease without heart failure: Secondary | ICD-10-CM | POA: Diagnosis not present

## 2014-06-27 DIAGNOSIS — G629 Polyneuropathy, unspecified: Secondary | ICD-10-CM | POA: Diagnosis not present

## 2014-06-27 DIAGNOSIS — S42351A Displaced comminuted fracture of shaft of humerus, right arm, initial encounter for closed fracture: Secondary | ICD-10-CM | POA: Diagnosis not present

## 2014-06-27 DIAGNOSIS — E119 Type 2 diabetes mellitus without complications: Secondary | ICD-10-CM | POA: Diagnosis not present

## 2014-06-27 DIAGNOSIS — G4701 Insomnia due to medical condition: Secondary | ICD-10-CM | POA: Diagnosis present

## 2014-06-27 DIAGNOSIS — S142XXD Injury of nerve root of cervical spine, subsequent encounter: Secondary | ICD-10-CM | POA: Diagnosis not present

## 2014-06-27 DIAGNOSIS — S42301S Unspecified fracture of shaft of humerus, right arm, sequela: Secondary | ICD-10-CM | POA: Diagnosis not present

## 2014-06-27 DIAGNOSIS — Z9889 Other specified postprocedural states: Secondary | ICD-10-CM | POA: Diagnosis not present

## 2014-06-27 DIAGNOSIS — M5410 Radiculopathy, site unspecified: Secondary | ICD-10-CM | POA: Diagnosis present

## 2014-06-27 DIAGNOSIS — E1140 Type 2 diabetes mellitus with diabetic neuropathy, unspecified: Secondary | ICD-10-CM | POA: Diagnosis not present

## 2014-06-27 DIAGNOSIS — I482 Chronic atrial fibrillation: Secondary | ICD-10-CM | POA: Diagnosis not present

## 2014-06-27 DIAGNOSIS — S1982XA Other specified injuries of cervical trachea, initial encounter: Secondary | ICD-10-CM | POA: Diagnosis not present

## 2014-06-27 LAB — URINE CULTURE
COLONY COUNT: NO GROWTH
CULTURE: NO GROWTH

## 2014-06-27 LAB — GLUCOSE, CAPILLARY
Glucose-Capillary: 72 mg/dL (ref 70–99)
Glucose-Capillary: 161 mg/dL — ABNORMAL HIGH (ref 70–99)

## 2014-06-27 LAB — CBC
HCT: 35.8 % — ABNORMAL LOW (ref 39.0–52.0)
HEMOGLOBIN: 10.7 g/dL — AB (ref 13.0–17.0)
MCH: 29.2 pg (ref 26.0–34.0)
MCHC: 29.9 g/dL — ABNORMAL LOW (ref 30.0–36.0)
MCV: 97.8 fL (ref 78.0–100.0)
PLATELETS: 186 10*3/uL (ref 150–400)
RBC: 3.66 MIL/uL — ABNORMAL LOW (ref 4.22–5.81)
RDW: 15.4 % (ref 11.5–15.5)
WBC: 4.8 10*3/uL (ref 4.0–10.5)

## 2014-06-27 LAB — BASIC METABOLIC PANEL
Anion gap: 5 (ref 5–15)
BUN: 11 mg/dL (ref 6–23)
CO2: 27 mmol/L (ref 19–32)
Calcium: 8.9 mg/dL (ref 8.4–10.5)
Chloride: 108 mEq/L (ref 96–112)
Creatinine, Ser: 1.36 mg/dL — ABNORMAL HIGH (ref 0.50–1.35)
GFR calc Af Amer: 58 mL/min — ABNORMAL LOW (ref >90)
GFR calc non Af Amer: 50 mL/min — ABNORMAL LOW (ref >90)
Glucose, Bld: 92 mg/dL (ref 70–99)
POTASSIUM: 4 mmol/L (ref 3.5–5.1)
SODIUM: 140 mmol/L (ref 135–145)

## 2014-06-27 MED ORDER — DABIGATRAN ETEXILATE MESYLATE 75 MG PO CAPS: 75.0000 mg | ORAL_CAPSULE | Freq: Two times a day (BID) | ORAL | Status: DC

## 2014-06-27 MED ORDER — TRAZODONE HCL 50 MG PO TABS: 50.0000 mg | ORAL_TABLET | Freq: Every evening | ORAL | Status: DC | PRN

## 2014-06-27 MED ORDER — METHOCARBAMOL 750 MG PO TABS: 750.0000 mg | ORAL_TABLET | Freq: Four times a day (QID) | ORAL | Status: DC

## 2014-06-27 MED ORDER — TRAMADOL-ACETAMINOPHEN 37.5-325 MG PO TABS: 2.0000 | ORAL_TABLET | Freq: Three times a day (TID) | ORAL | Status: DC

## 2014-06-27 MED ORDER — POLYETHYLENE GLYCOL 3350 17 G PO PACK: 17.0000 g | PACK | Freq: Every day | ORAL | Status: DC

## 2014-06-27 MED ORDER — SENNOSIDES-DOCUSATE SODIUM 8.6-50 MG PO TABS: 2.0000 | ORAL_TABLET | Freq: Two times a day (BID) | ORAL | Status: DC

## 2014-06-27 MED ORDER — GLIMEPIRIDE 4 MG PO TABS: 4.0000 mg | ORAL_TABLET | Freq: Every day | ORAL | Status: DC

## 2014-06-27 MED ORDER — GABAPENTIN 100 MG PO CAPS: 200.0000 mg | ORAL_CAPSULE | Freq: Two times a day (BID) | ORAL | Status: DC
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
## 2014-06-27 NOTE — Progress Notes (Signed)
Social Work Patient ID: Gabriel Riley, male   DOB: Jun 08, 1941, 74 y.o.   MRN: [id number]  Received SNF bed offer yesterday afternoon from [facility]  Pt and family accepting this bed offer with plan to transfer to facility this afternoon.  Roslynn Holte, LCSW

## 2014-06-27 NOTE — Progress Notes (Signed)
Occupational Therapy Session Note  Patient Details  Name: Gabriel Riley MRN: [id number] Date of Birth: 1940-12-30  Today's Date: 06/27/2014 OT Individual Time: 6789-3810 OT Individual Time Calculation (min): 45 min    Short Term Goals: Week 2:  OT Short Term Goal 1 (Week 2): Pt will perform LB dressing with Mod  A in order to increase I in self care. OT Short Term Goal 2 (Week 2): Pt will perform UB dressing with Min A in order to increase I in self care. OT Short Term Goal 3 (Week 2): Pt will complete toilet hygiene with min assist for thoroughness OT Short Term Goal 4 (Week 2): Pt will complete functional transfers on/off toilet with supervision OT Short Term Goal 5 (Week 2): Pt will complete bathing sitting/standing with min assist  Skilled Therapeutic Interventions/Progress Updates:    Pt engaged in BADL retraining including bathing and dressing with sit<>stand from w/c at sink.  Pt declined shower this morning.  Pt used AE appropriately to assist with LB bathing and dressing tasks.  Pt continues to require assistance with UB bathing and dressing.  Pt requires extra time to complete all tasks with multiple rest breaks.  Therapy Documentation Precautions:  Precautions Precautions: Fall, Cervical Precaution Comments: no pushing, pulling, lifting with RUE Required Braces or Orthoses: Sling Cervical Brace: Other (comment) (daughter has taken c-collar home - PT asked pt to have her bring it back. Pt reports surgeon said he no longer needs c-collar. ) Restrictions Weight Bearing Restrictions: Yes RUE Weight Bearing: Non weight bearing LUE Weight Bearing: Weight bearing as tolerated RLE Weight Bearing: Weight bearing as tolerated LLE Weight Bearing: Weight bearing as tolerated Pain: Pain Assessment Pain Assessment: 0-10 Pain Score: 7  Pain Type: Surgical pain Pain Location: Shoulder Pain Orientation: Right Pain Descriptors / Indicators: Aching;Radiating (shoulder to the  elbow) Pain Onset: On-going Pain Intervention(s): Rest;Emotional support;Other (Comment) (sling in place) Multiple Pain Sites: No  See FIM for current functional status  Therapy/Group: Individual Therapy  Leroy Libman 06/27/2014, 9:56 AM

## 2014-06-27 NOTE — Progress Notes (Signed)
Physical Therapy Discharge Summary  Patient Details  Name: Gabriel Riley MRN: [id number] Date of Birth: May 07, 1941  Today's Date: 06/27/2014 PT Individual Time: 4098-1191 PT Individual Time Calculation (min): 45 min    Patient has met 8 of 11 long term goals due to improved activity tolerance, improved balance, ability to compensate for deficits and improved coordination.  Patient to discharge at a wheelchair level Modified Independent.   Patient is to d/c to SNF for the needed physical assistance at discharge.  Reasons goals not met: Pt's activity tolerance continues to be low and he is unable to ambulate a distance of 50'.   Recommendation:  Patient will benefit from ongoing skilled PT services in skilled nursing facility setting to continue to advance safe functional mobility, address ongoing impairments in balance, activity tolerance, ambulation, and minimize fall risk.  Equipment: No equipment provided  Reasons for discharge: discharge from hospital  Patient/family agrees with progress made and goals achieved: Yes  Therapeutic Interventions Treatment Session 1: Therapeutic Activity: Pt demonstrates mod I bed mobility supine to/from sit via L side lie from flat bed without bedrails.  PT instructs pt in car and furniture (soft loveseat) transfer req SBA with SBQC and verbal cues to lean trunk very far forward and then accept weight into his legs before switching hand position to hold the [facility].   W/C Management: Pt demonstrates mod I w/c propulsion with B LEs and L UE in community setting and controlled setting x 300' each and mod I brakes management (pt does not use legrests).   Gait Training: PT instructs pt in ambulation in community setting x 30' with LBQC req SBA for safety and verbal cues for upright posture and 3 point gait technique - poor demonstration by pt.   Pt has progressed with PT, but continues to req 24 hour assist for safety and continued progress  towards PLOF, which family cannot provide, so pt needs to d/c to SNF.    PT Discharge Precautions/Restrictions Precautions Precautions: Fall;Cervical Precaution Comments: no pushing, pulling, lifting with RUE Required Braces or Orthoses: Sling Restrictions Weight Bearing Restrictions: Yes RUE Weight Bearing: Non weight bearing LUE Weight Bearing: Weight bearing as tolerated RLE Weight Bearing: Weight bearing as tolerated LLE Weight Bearing: Weight bearing as tolerated Pain Pain Assessment Pain Assessment: 0-10 Pain Score: 7  Pain Type: Surgical pain Pain Location: Shoulder Pain Orientation: Right Pain Descriptors / Indicators: Aching;Radiating (shoulder to the elbow) Pain Onset: On-going Pain Intervention(s): Rest;Emotional support;Other (Comment) (sling in place) Multiple Pain Sites: No Vision/Perception  Perception Comments: wfl  Cognition Overall Cognitive Status: Impaired/Different from baseline Arousal/Alertness: Awake/alert Orientation Level: Oriented X4 Focused Attention: Appears intact Selective Attention: Appears intact Divided Attention: Impaired Divided Attention Impairment: Functional basic (talking while propelling w/c or walking) Memory: Impaired Memory Impairment: Retrieval deficit;Decreased recall of new information Awareness: Appears intact Problem Solving: Impaired Problem Solving Impairment: Functional complex Safety/Judgment: Appears intact Sensation Sensation Light Touch: Impaired Detail Light Touch Impaired Details: Impaired LUE;Impaired RUE;Impaired RLE;Impaired LLE (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; tingly first 3 fingers from cervical radiculopathy) Stereognosis: Not tested Hot/Cold: Not tested Proprioception: Appears Intact Coordination Gross Motor Movements are Fluid and Coordinated: No Fine Motor Movements are Fluid and Coordinated: No Coordination and Movement Description: antalgic movements Motor  Motor Motor:  Abnormal postural alignment and control Motor - Discharge Observations: rounded shoulders, forward head, head down  Mobility Bed Mobility Bed Mobility: Sit to Sidelying Left Left Sidelying to Sit: 6: Modified independent (Device/Increase time) Sitting - Scoot to  Edge of Bed: 6: Modified independent (Device/Increase time) Sit to Sidelying Left: 6: Modified independent (Device/Increase time) Transfers Transfers: Yes Sit to Stand: 5: Supervision Sit to Stand Details: Verbal cues for precautions/safety Stand Pivot Transfers: 5: Supervision Stand Pivot Transfer Details: Verbal cues for precautions/safety Locomotion  Ambulation Ambulation: Yes Ambulation/Gait Assistance: 5: Supervision Ambulation Distance (Feet): 30 Feet Assistive device: Large base quad cane Ambulation/Gait Assistance Details: Verbal cues for safe use of DME/AE Ambulation/Gait Assistance Details: 3 point gait cues with [facility] Gait Gait: Yes Gait Pattern: Impaired Gait Pattern: Trunk flexed;Wide base of support;Step-to pattern;Antalgic Gait velocity: slow Stairs / Additional Locomotion Stairs: No Architect: Yes Wheelchair Assistance: 6: Modified independent (Device/Increase time) Wheelchair Propulsion: Left upper extremity;Both lower extermities Wheelchair Parts Management: Independent Distance: 300  Trunk/Postural Assessment  Cervical Assessment Cervical Assessment: Exceptions to [facility] Cervical AROM Overall Cervical AROM: Deficits;Due to precautions Overall Cervical AROM Comments: PT instructs pt to avoid end range motion due to recent ACDF surgery Thoracic Assessment Thoracic Assessment: Within Functional Limits Lumbar Assessment Lumbar Assessment: Within Functional Limits Postural Control Postural Control: Deficits on evaluation Head Control: limited due to cervical precautions - head held down and forward Trunk Control: kyphotic Postural Limitations: slouched sitting posture  with forward and down head  Balance Balance Balance Assessed: Yes Static Sitting Balance Static Sitting - Balance Support: Feet supported;No upper extremity supported Static Sitting - Level of Assistance: 7: Independent Dynamic Sitting Balance Dynamic Sitting - Balance Support: Left upper extremity supported;Feet supported Dynamic Sitting - Level of Assistance: 6: Modified independent (Device/Increase time) Static Standing Balance Static Standing - Balance Support: Left upper extremity supported;During functional activity Static Standing - Level of Assistance: 5: Stand by assistance Dynamic Standing Balance Dynamic Standing - Balance Support: Left upper extremity supported;During functional activity Dynamic Standing - Level of Assistance: 5: Stand by assistance Dynamic Standing - Balance Activities: Lateral lean/weight shifting;Forward lean/weight shifting Extremity Assessment  RUE Assessment RUE Assessment: Exceptions to [facility] RUE AROM (degrees) Overall AROM Right Upper Extremity: Deficits;Due to pain;Due to precautions RUE Overall AROM Comments: elbow, wrist, and fingers WFLs. Order for PROM for forward flexion 0- 60 degrees ,abduction - 60 degrees,  and pendulum exercises. RUE Strength RUE Overall Strength: Deficits;Due to precautions RUE Overall Strength Comments: fingers >= 3/5, wrist >= 3/5, elbow 2+/5, shoulder NT due to precautions LUE Assessment LUE Assessment: Within Functional Limits RLE Assessment RLE Assessment: Within Functional Limits LLE Assessment LLE Assessment: Within Functional Limits  See FIM for current functional status  Tawan Corkern M 06/27/2014, 10:40 AM

## 2014-06-27 NOTE — Progress Notes (Signed)
Speech Language Pathology Session Note & Discharge Summary  Patient Details  Name: Gabriel Riley MRN: [id number] Date of Birth: [birth year]  Today's Date: 06/27/2014 SLP Individual Time: 0820-0900 SLP Individual Time Calculation (min): 40 min   Skilled Therapeutic Interventions:  Skilled treatment session focused on cognitive goals. SLP facilitated session by administering the [facility] due to visual deficits and patient scored an 18/22 with a score of 18 or above considered normal. Patient also followed 3 step directions with 100% accuracy and completed auditory comprehension tasks at the paragraph level with 100% accuracy. Patient reports he feels he is back to his cognitive baseline, however, patient continues to demonstrate memory deficits which he reports he was experiencing prior to his hospitalization. Patient left in wheelchair with all needs within reach.   Patient has met 3 of 3 long term goals.  Patient to discharge at overall Supervision level.   Reasons goals not met: N/A   Clinical Impression/Discharge Summary: Patient has made functional gains and has met 3 of 3 LTG's this admission due to increased word-finding, working memory and functional problem solving.  Currently, patient requires overall supervision level cues to complete functional and familiar tasks safely. Patient is also overall Mod I-supervision for word-finding during a functional conversation. Patient's family is unable to provide the necessary physical and cognitive assistance needed at this time, therefore, patient will discharge to a SNF. Patient would benefit from f/u SLP services at next venue of care to maximize his cognitive function and overall functional independence in order to reduce caregiver burden.   Care Partner:  Caregiver Able to Provide Assistance: No  Type of Caregiver Assistance: Physical;Cognitive  Recommendation:  Skilled Nursing facility;24 hour supervision/assistance  Rationale for  SLP Follow Up: Maximize cognitive function and independence;Reduce caregiver burden   Equipment: N/A   Reasons for discharge: Treatment goals met;Discharged from hospital   Patient/Family Agrees with Progress Made and Goals Achieved: Yes   See FIM for current functional status  Sybella Harnish 06/27/2014, 3:10 PM

## 2014-06-27 NOTE — Progress Notes (Signed)
Report called to [place] in [place]. Patient discharged via EMS with belongings to [place].

## 2014-06-27 NOTE — Progress Notes (Signed)
Social Work  Discharge Note  The overall goal for the admission was met for:   Discharge location: No - plan changed to SNF per limited progress on CIR  Length of Stay: No - extended slightly due to SNF bed search - LOS = 15 days  Discharge activity level: No - mod/ max assistance  Home/community participation: No  Services provided included: MD, RD, PT, OT, SLP, RN, TR, Pharmacy, Neuropsych and SW  Financial Services: Medicare and Private Insurance: [place]  Follow-up services arranged: Other: SNF @ [facility] [place]  Comments (or additional information):  Patient/Family verbalized understanding of follow-up arrangements: Yes  Individual responsible for coordination of the follow-up plan: patient (with children's assist)  Confirmed correct DME delivered: NA  Angle Karel

## 2014-06-27 NOTE — Plan of Care (Signed)
Problem: RH Ambulation Goal: LTG Patient will ambulate in controlled environment (PT) LTG: Patient will ambulate in a controlled environment, # of feet with assistance (PT).  Outcome: Not Met (add Reason) Pt is only able to ambulate up to 30'.  Goal: LTG Patient will ambulate in home environment (PT) LTG: Patient will ambulate in home environment, # of feet with assistance (PT).  Outcome: Not Met (add Reason) Pt is only able to ambulate up to 30'.  Goal: LTG Patient will ambulate in community environment (PT) LTG: Patient will ambulate in community environment, # of feet with assistance (PT).  Outcome: Not Met (add Reason) Pt is only able to ambulate up to 30'.

## 2014-06-27 NOTE — Progress Notes (Signed)
Occupational Therapy Discharge Summary  Patient Details  Name: Gabriel Riley MRN: [id number] Date of Birth: 08/19/1940  Patient has met 7 of 11 long term goals due to improved balance and ability to compensate for deficits.  Pt made slow progress with BADLs during this admission.  Pt continues to fatigue easily and requires extra time to complete tasks with multiple rest breaks.  Pt to discharge to SNF for further OT. Patient to discharge at overall Mod Assist level.  Patient's care partner requires assistance to provide the necessary physical and cognitive assistance at discharge, therefore pt discharged to SNF    Reasons goals not met: Poor endurance, pain limiting functional recovery of RUE, impaired memory and problem-solving skills.  Recommendation:  Patient will benefit from ongoing skilled OT services in skilled nursing facility setting to continue to advance functional skills in the area of BADL.  Equipment: No equipment provided  Reasons for discharge: discharge from hospital  Patient/family agrees with progress made and goals achieved: Yes  OT Discharge  Vision/Perception  Vision- History Baseline Vision/History: Wears glasses Patient Visual Report: Blurring of vision Vision- Assessment Vision Assessment?: Vision impaired- to be further tested in functional context   Cognition Overall Cognitive Status: Impaired/Different from baseline Arousal/Alertness: Awake/alert Orientation Level: Oriented X4 Attention: Selective Focused Attention: Appears intact Selective Attention: Appears intact Alternating Attention: Impaired Alternating Attention Impairment: Functional complex Divided Attention: Impaired Divided Attention Impairment: Functional basic Memory: Impaired Memory Impairment: Retrieval deficit;Decreased recall of new information Awareness: Appears intact Problem Solving: Impaired Problem Solving Impairment: Functional complex Safety/Judgment: Appears  intact  Sensation Sensation Light Touch: Impaired Detail Light Touch Impaired Details: Impaired LUE;Impaired RUE;Impaired RLE;Impaired LLE Hot/Cold: Appears Intact Proprioception: Appears Intact Coordination Gross Motor Movements are Fluid and Coordinated: No Fine Motor Movements are Fluid and Coordinated: No Coordination and Movement Description: antalgic movements Finger Nose Finger Test: impaired R UE s/p surgery  Trunk/Postural Assessment  Cervical Assessment Cervical Assessment: Within Functional Limits Cervical AROM Overall Cervical AROM: Deficits;Due to precautions Thoracic Assessment Thoracic Assessment: Within Functional Limits Lumbar Assessment Lumbar Assessment: Within Functional Limits Postural Control Postural Control: Deficits on evaluation Head Control: limited due to cervical precautions - head held down and forward Trunk Control: kyphotic Postural Limitations: slouched sitting posture with forward and down head   Balance Static Sitting Balance Static Sitting - Balance Support: Feet supported;No upper extremity supported Static Sitting - Level of Assistance: 7: Independent Dynamic Sitting Balance Dynamic Sitting - Balance Support: Left upper extremity supported;Feet supported Dynamic Sitting - Level of Assistance: 6: Modified independent (Device/Increase time)  Extremity/Trunk Assessment RUE Assessment RUE Assessment: Exceptions to [facility] RUE AROM (degrees) Overall AROM Right Upper Extremity: Deficits;Due to pain;Due to precautions RUE Overall AROM Comments: elbow, wrist, and fingers WFLs. Order for PROM for forward flexion 0- 60 degrees ,abduction - 60 degrees,  and pendulum exercises. RUE Strength RUE Overall Strength: Deficits;Due to precautions RUE Overall Strength Comments: fingers >= 3/5, wrist >= 3/5, elbow 2+/5, shoulder NT due to precautions LUE Assessment LUE Assessment: Within Functional Limits  See FIM for current functional status  Leroy Libman 06/27/2014, 2:54 PM

## 2014-06-29 DIAGNOSIS — I119 Hypertensive heart disease without heart failure: Secondary | ICD-10-CM | POA: Diagnosis not present

## 2014-06-29 DIAGNOSIS — S42201D Unspecified fracture of upper end of right humerus, subsequent encounter for fracture with routine healing: Secondary | ICD-10-CM | POA: Diagnosis not present

## 2014-06-29 DIAGNOSIS — I4891 Unspecified atrial fibrillation: Secondary | ICD-10-CM | POA: Diagnosis not present

## 2014-06-29 DIAGNOSIS — E119 Type 2 diabetes mellitus without complications: Secondary | ICD-10-CM | POA: Diagnosis not present

## 2014-07-03 DIAGNOSIS — S42351A Displaced comminuted fracture of shaft of humerus, right arm, initial encounter for closed fracture: Secondary | ICD-10-CM | POA: Diagnosis not present

## 2014-07-03 DIAGNOSIS — Z9889 Other specified postprocedural states: Secondary | ICD-10-CM | POA: Diagnosis not present

## 2014-07-05 ENCOUNTER — Encounter (HOSPITAL_COMMUNITY): Payer: Self-pay | Admitting: Neurological Surgery

## 2014-07-05 DIAGNOSIS — I10 Essential (primary) hypertension: Secondary | ICD-10-CM | POA: Diagnosis not present

## 2014-07-05 DIAGNOSIS — Z6826 Body mass index (BMI) 26.0-26.9, adult: Secondary | ICD-10-CM | POA: Diagnosis not present

## 2014-07-05 DIAGNOSIS — S12501D Unspecified nondisplaced fracture of sixth cervical vertebra, subsequent encounter for fracture with routine healing: Secondary | ICD-10-CM | POA: Diagnosis not present

## 2014-07-05 DIAGNOSIS — M542 Cervicalgia: Secondary | ICD-10-CM | POA: Diagnosis not present

## 2014-07-29 DIAGNOSIS — J90 Pleural effusion, not elsewhere classified: Secondary | ICD-10-CM | POA: Diagnosis not present

## 2014-07-29 DIAGNOSIS — R918 Other nonspecific abnormal finding of lung field: Secondary | ICD-10-CM | POA: Diagnosis not present

## 2014-07-29 DIAGNOSIS — I509 Heart failure, unspecified: Secondary | ICD-10-CM | POA: Diagnosis not present

## 2014-07-29 DIAGNOSIS — I4891 Unspecified atrial fibrillation: Secondary | ICD-10-CM | POA: Diagnosis not present

## 2014-07-29 DIAGNOSIS — R0989 Other specified symptoms and signs involving the circulatory and respiratory systems: Secondary | ICD-10-CM | POA: Diagnosis not present

## 2014-07-29 DIAGNOSIS — I5032 Chronic diastolic (congestive) heart failure: Secondary | ICD-10-CM | POA: Diagnosis not present

## 2014-07-30 DIAGNOSIS — Z87891 Personal history of nicotine dependence: Secondary | ICD-10-CM | POA: Diagnosis not present

## 2014-07-30 DIAGNOSIS — J9690 Respiratory failure, unspecified, unspecified whether with hypoxia or hypercapnia: Secondary | ICD-10-CM | POA: Diagnosis not present

## 2014-07-30 DIAGNOSIS — Z9049 Acquired absence of other specified parts of digestive tract: Secondary | ICD-10-CM | POA: Diagnosis present

## 2014-07-30 DIAGNOSIS — I2510 Atherosclerotic heart disease of native coronary artery without angina pectoris: Secondary | ICD-10-CM | POA: Diagnosis not present

## 2014-07-30 DIAGNOSIS — Z8673 Personal history of transient ischemic attack (TIA), and cerebral infarction without residual deficits: Secondary | ICD-10-CM | POA: Diagnosis not present

## 2014-07-30 DIAGNOSIS — R918 Other nonspecific abnormal finding of lung field: Secondary | ICD-10-CM | POA: Diagnosis not present

## 2014-07-30 DIAGNOSIS — K429 Umbilical hernia without obstruction or gangrene: Secondary | ICD-10-CM | POA: Diagnosis not present

## 2014-07-30 DIAGNOSIS — E1165 Type 2 diabetes mellitus with hyperglycemia: Secondary | ICD-10-CM | POA: Diagnosis not present

## 2014-07-30 DIAGNOSIS — I252 Old myocardial infarction: Secondary | ICD-10-CM | POA: Diagnosis not present

## 2014-07-30 DIAGNOSIS — R0600 Dyspnea, unspecified: Secondary | ICD-10-CM | POA: Diagnosis not present

## 2014-07-30 DIAGNOSIS — I4891 Unspecified atrial fibrillation: Secondary | ICD-10-CM | POA: Diagnosis not present

## 2014-07-30 DIAGNOSIS — I10 Essential (primary) hypertension: Secondary | ICD-10-CM | POA: Diagnosis not present

## 2014-07-30 DIAGNOSIS — R0989 Other specified symptoms and signs involving the circulatory and respiratory systems: Secondary | ICD-10-CM | POA: Diagnosis not present

## 2014-07-30 DIAGNOSIS — Z9981 Dependence on supplemental oxygen: Secondary | ICD-10-CM | POA: Diagnosis not present

## 2014-07-30 DIAGNOSIS — Z7401 Bed confinement status: Secondary | ICD-10-CM | POA: Diagnosis not present

## 2014-07-30 DIAGNOSIS — I509 Heart failure, unspecified: Secondary | ICD-10-CM | POA: Diagnosis not present

## 2014-07-30 DIAGNOSIS — J90 Pleural effusion, not elsewhere classified: Secondary | ICD-10-CM | POA: Diagnosis not present

## 2014-07-30 DIAGNOSIS — E785 Hyperlipidemia, unspecified: Secondary | ICD-10-CM | POA: Diagnosis not present

## 2014-07-30 DIAGNOSIS — Z9119 Patient's noncompliance with other medical treatment and regimen: Secondary | ICD-10-CM | POA: Diagnosis not present

## 2014-07-30 DIAGNOSIS — K219 Gastro-esophageal reflux disease without esophagitis: Secondary | ICD-10-CM | POA: Diagnosis present

## 2014-07-30 DIAGNOSIS — I5033 Acute on chronic diastolic (congestive) heart failure: Secondary | ICD-10-CM | POA: Diagnosis not present

## 2014-07-30 DIAGNOSIS — J9601 Acute respiratory failure with hypoxia: Secondary | ICD-10-CM | POA: Diagnosis present

## 2014-08-02 DIAGNOSIS — Z79899 Other long term (current) drug therapy: Secondary | ICD-10-CM | POA: Diagnosis not present

## 2014-08-02 DIAGNOSIS — I484 Atypical atrial flutter: Secondary | ICD-10-CM | POA: Diagnosis not present

## 2014-08-02 DIAGNOSIS — Z8673 Personal history of transient ischemic attack (TIA), and cerebral infarction without residual deficits: Secondary | ICD-10-CM | POA: Diagnosis not present

## 2014-08-02 DIAGNOSIS — I252 Old myocardial infarction: Secondary | ICD-10-CM | POA: Diagnosis not present

## 2014-08-02 DIAGNOSIS — R0602 Shortness of breath: Secondary | ICD-10-CM | POA: Diagnosis not present

## 2014-08-02 DIAGNOSIS — S142XXD Injury of nerve root of cervical spine, subsequent encounter: Secondary | ICD-10-CM | POA: Diagnosis not present

## 2014-08-02 DIAGNOSIS — J9690 Respiratory failure, unspecified, unspecified whether with hypoxia or hypercapnia: Secondary | ICD-10-CM | POA: Diagnosis not present

## 2014-08-02 DIAGNOSIS — I509 Heart failure, unspecified: Secondary | ICD-10-CM | POA: Diagnosis not present

## 2014-08-02 DIAGNOSIS — I4891 Unspecified atrial fibrillation: Secondary | ICD-10-CM | POA: Diagnosis not present

## 2014-08-02 DIAGNOSIS — J811 Chronic pulmonary edema: Secondary | ICD-10-CM | POA: Diagnosis not present

## 2014-08-02 DIAGNOSIS — I493 Ventricular premature depolarization: Secondary | ICD-10-CM | POA: Diagnosis not present

## 2014-08-02 DIAGNOSIS — Z9889 Other specified postprocedural states: Secondary | ICD-10-CM | POA: Diagnosis not present

## 2014-08-02 DIAGNOSIS — J9622 Acute and chronic respiratory failure with hypercapnia: Secondary | ICD-10-CM | POA: Diagnosis present

## 2014-08-02 DIAGNOSIS — Z9119 Patient's noncompliance with other medical treatment and regimen: Secondary | ICD-10-CM | POA: Diagnosis not present

## 2014-08-02 DIAGNOSIS — Z9114 Patient's other noncompliance with medication regimen: Secondary | ICD-10-CM | POA: Diagnosis present

## 2014-08-02 DIAGNOSIS — I5023 Acute on chronic systolic (congestive) heart failure: Secondary | ICD-10-CM | POA: Diagnosis present

## 2014-08-02 DIAGNOSIS — I2510 Atherosclerotic heart disease of native coronary artery without angina pectoris: Secondary | ICD-10-CM | POA: Diagnosis not present

## 2014-08-02 DIAGNOSIS — Z9981 Dependence on supplemental oxygen: Secondary | ICD-10-CM | POA: Diagnosis not present

## 2014-08-02 DIAGNOSIS — I10 Essential (primary) hypertension: Secondary | ICD-10-CM | POA: Diagnosis not present

## 2014-08-02 DIAGNOSIS — R0600 Dyspnea, unspecified: Secondary | ICD-10-CM | POA: Diagnosis not present

## 2014-08-02 DIAGNOSIS — I429 Cardiomyopathy, unspecified: Secondary | ICD-10-CM | POA: Diagnosis present

## 2014-08-02 DIAGNOSIS — E1140 Type 2 diabetes mellitus with diabetic neuropathy, unspecified: Secondary | ICD-10-CM | POA: Diagnosis not present

## 2014-08-02 DIAGNOSIS — J90 Pleural effusion, not elsewhere classified: Secondary | ICD-10-CM | POA: Diagnosis not present

## 2014-08-02 DIAGNOSIS — I482 Chronic atrial fibrillation: Secondary | ICD-10-CM | POA: Diagnosis present

## 2014-08-02 DIAGNOSIS — Z7401 Bed confinement status: Secondary | ICD-10-CM | POA: Diagnosis not present

## 2014-08-02 DIAGNOSIS — S42201D Unspecified fracture of upper end of right humerus, subsequent encounter for fracture with routine healing: Secondary | ICD-10-CM | POA: Diagnosis not present

## 2014-08-02 DIAGNOSIS — E1165 Type 2 diabetes mellitus with hyperglycemia: Secondary | ICD-10-CM | POA: Diagnosis present

## 2014-08-02 DIAGNOSIS — K429 Umbilical hernia without obstruction or gangrene: Secondary | ICD-10-CM | POA: Diagnosis not present

## 2014-08-02 DIAGNOSIS — I4460 Unspecified fascicular block: Secondary | ICD-10-CM | POA: Diagnosis not present

## 2014-08-02 DIAGNOSIS — I5033 Acute on chronic diastolic (congestive) heart failure: Secondary | ICD-10-CM | POA: Diagnosis not present

## 2014-08-02 DIAGNOSIS — K219 Gastro-esophageal reflux disease without esophagitis: Secondary | ICD-10-CM | POA: Diagnosis present

## 2014-08-02 DIAGNOSIS — J9601 Acute respiratory failure with hypoxia: Secondary | ICD-10-CM | POA: Diagnosis not present

## 2014-08-02 DIAGNOSIS — E785 Hyperlipidemia, unspecified: Secondary | ICD-10-CM | POA: Diagnosis not present

## 2014-08-02 DIAGNOSIS — I480 Paroxysmal atrial fibrillation: Secondary | ICD-10-CM | POA: Diagnosis not present

## 2014-08-02 DIAGNOSIS — J9692 Respiratory failure, unspecified with hypercapnia: Secondary | ICD-10-CM | POA: Diagnosis not present

## 2014-08-02 DIAGNOSIS — F419 Anxiety disorder, unspecified: Secondary | ICD-10-CM | POA: Diagnosis present

## 2014-08-03 DIAGNOSIS — I509 Heart failure, unspecified: Secondary | ICD-10-CM | POA: Diagnosis not present

## 2014-08-03 DIAGNOSIS — I10 Essential (primary) hypertension: Secondary | ICD-10-CM | POA: Diagnosis not present

## 2014-08-09 DIAGNOSIS — Z9889 Other specified postprocedural states: Secondary | ICD-10-CM | POA: Diagnosis not present

## 2014-08-16 DIAGNOSIS — I480 Paroxysmal atrial fibrillation: Secondary | ICD-10-CM | POA: Diagnosis not present

## 2014-08-17 ENCOUNTER — Inpatient Hospital Stay: Payer: Medicare Other | Admitting: Physical Medicine & Rehabilitation

## 2014-08-19 DIAGNOSIS — I482 Chronic atrial fibrillation: Secondary | ICD-10-CM | POA: Diagnosis not present

## 2014-08-19 DIAGNOSIS — I493 Ventricular premature depolarization: Secondary | ICD-10-CM | POA: Diagnosis not present

## 2014-08-19 DIAGNOSIS — Z794 Long term (current) use of insulin: Secondary | ICD-10-CM | POA: Diagnosis not present

## 2014-08-19 DIAGNOSIS — I2510 Atherosclerotic heart disease of native coronary artery without angina pectoris: Secondary | ICD-10-CM | POA: Diagnosis not present

## 2014-08-19 DIAGNOSIS — I480 Paroxysmal atrial fibrillation: Secondary | ICD-10-CM | POA: Diagnosis not present

## 2014-08-19 DIAGNOSIS — J9621 Acute and chronic respiratory failure with hypoxia: Secondary | ICD-10-CM | POA: Diagnosis not present

## 2014-08-19 DIAGNOSIS — J9622 Acute and chronic respiratory failure with hypercapnia: Secondary | ICD-10-CM | POA: Diagnosis present

## 2014-08-19 DIAGNOSIS — J90 Pleural effusion, not elsewhere classified: Secondary | ICD-10-CM | POA: Diagnosis not present

## 2014-08-19 DIAGNOSIS — E1165 Type 2 diabetes mellitus with hyperglycemia: Secondary | ICD-10-CM | POA: Diagnosis not present

## 2014-08-19 DIAGNOSIS — Z7401 Bed confinement status: Secondary | ICD-10-CM | POA: Diagnosis not present

## 2014-08-19 DIAGNOSIS — I429 Cardiomyopathy, unspecified: Secondary | ICD-10-CM | POA: Diagnosis not present

## 2014-08-19 DIAGNOSIS — F419 Anxiety disorder, unspecified: Secondary | ICD-10-CM | POA: Diagnosis present

## 2014-08-19 DIAGNOSIS — I5023 Acute on chronic systolic (congestive) heart failure: Secondary | ICD-10-CM | POA: Diagnosis not present

## 2014-08-19 DIAGNOSIS — R0602 Shortness of breath: Secondary | ICD-10-CM | POA: Diagnosis not present

## 2014-08-19 DIAGNOSIS — I4460 Unspecified fascicular block: Secondary | ICD-10-CM | POA: Diagnosis not present

## 2014-08-19 DIAGNOSIS — J9620 Acute and chronic respiratory failure, unspecified whether with hypoxia or hypercapnia: Secondary | ICD-10-CM | POA: Diagnosis not present

## 2014-08-19 DIAGNOSIS — Z9114 Patient's other noncompliance with medication regimen: Secondary | ICD-10-CM | POA: Diagnosis present

## 2014-08-19 DIAGNOSIS — I5043 Acute on chronic combined systolic (congestive) and diastolic (congestive) heart failure: Secondary | ICD-10-CM | POA: Diagnosis not present

## 2014-08-19 DIAGNOSIS — S42201D Unspecified fracture of upper end of right humerus, subsequent encounter for fracture with routine healing: Secondary | ICD-10-CM | POA: Diagnosis not present

## 2014-08-19 DIAGNOSIS — I252 Old myocardial infarction: Secondary | ICD-10-CM | POA: Diagnosis not present

## 2014-08-19 DIAGNOSIS — J811 Chronic pulmonary edema: Secondary | ICD-10-CM | POA: Diagnosis not present

## 2014-08-19 DIAGNOSIS — I509 Heart failure, unspecified: Secondary | ICD-10-CM | POA: Diagnosis not present

## 2014-08-19 DIAGNOSIS — R0600 Dyspnea, unspecified: Secondary | ICD-10-CM | POA: Diagnosis not present

## 2014-08-19 DIAGNOSIS — J9692 Respiratory failure, unspecified with hypercapnia: Secondary | ICD-10-CM | POA: Diagnosis not present

## 2014-08-19 DIAGNOSIS — I10 Essential (primary) hypertension: Secondary | ICD-10-CM | POA: Diagnosis not present

## 2014-08-19 DIAGNOSIS — I4891 Unspecified atrial fibrillation: Secondary | ICD-10-CM | POA: Diagnosis not present

## 2014-08-19 DIAGNOSIS — Z8673 Personal history of transient ischemic attack (TIA), and cerebral infarction without residual deficits: Secondary | ICD-10-CM | POA: Diagnosis not present

## 2014-08-19 DIAGNOSIS — K219 Gastro-esophageal reflux disease without esophagitis: Secondary | ICD-10-CM | POA: Diagnosis present

## 2014-08-19 DIAGNOSIS — Z79899 Other long term (current) drug therapy: Secondary | ICD-10-CM | POA: Diagnosis not present

## 2014-08-19 DIAGNOSIS — Z9981 Dependence on supplemental oxygen: Secondary | ICD-10-CM | POA: Diagnosis not present

## 2014-08-27 DIAGNOSIS — Z9981 Dependence on supplemental oxygen: Secondary | ICD-10-CM | POA: Diagnosis not present

## 2014-08-27 DIAGNOSIS — I4891 Unspecified atrial fibrillation: Secondary | ICD-10-CM | POA: Diagnosis not present

## 2014-08-27 DIAGNOSIS — I509 Heart failure, unspecified: Secondary | ICD-10-CM | POA: Diagnosis not present

## 2014-08-27 DIAGNOSIS — S42201D Unspecified fracture of upper end of right humerus, subsequent encounter for fracture with routine healing: Secondary | ICD-10-CM | POA: Diagnosis not present

## 2014-08-27 DIAGNOSIS — I10 Essential (primary) hypertension: Secondary | ICD-10-CM | POA: Diagnosis not present

## 2014-08-27 DIAGNOSIS — Z9114 Patient's other noncompliance with medication regimen: Secondary | ICD-10-CM | POA: Diagnosis not present

## 2014-08-27 DIAGNOSIS — I5032 Chronic diastolic (congestive) heart failure: Secondary | ICD-10-CM | POA: Diagnosis not present

## 2014-08-27 DIAGNOSIS — Z7401 Bed confinement status: Secondary | ICD-10-CM | POA: Diagnosis not present

## 2014-08-27 DIAGNOSIS — E119 Type 2 diabetes mellitus without complications: Secondary | ICD-10-CM | POA: Diagnosis not present

## 2014-08-27 DIAGNOSIS — Z794 Long term (current) use of insulin: Secondary | ICD-10-CM | POA: Diagnosis not present

## 2014-08-27 DIAGNOSIS — I2510 Atherosclerotic heart disease of native coronary artery without angina pectoris: Secondary | ICD-10-CM | POA: Diagnosis not present

## 2014-08-27 DIAGNOSIS — I482 Chronic atrial fibrillation: Secondary | ICD-10-CM | POA: Diagnosis not present

## 2014-08-27 DIAGNOSIS — Z79899 Other long term (current) drug therapy: Secondary | ICD-10-CM | POA: Diagnosis not present

## 2014-08-27 DIAGNOSIS — I4892 Unspecified atrial flutter: Secondary | ICD-10-CM | POA: Diagnosis not present

## 2014-08-27 DIAGNOSIS — E1165 Type 2 diabetes mellitus with hyperglycemia: Secondary | ICD-10-CM | POA: Diagnosis not present

## 2014-08-27 DIAGNOSIS — I5043 Acute on chronic combined systolic (congestive) and diastolic (congestive) heart failure: Secondary | ICD-10-CM | POA: Diagnosis not present

## 2014-08-27 DIAGNOSIS — E784 Other hyperlipidemia: Secondary | ICD-10-CM | POA: Diagnosis not present

## 2014-08-29 ENCOUNTER — Encounter: Payer: Medicare Other | Admitting: Physical Medicine & Rehabilitation

## 2014-08-30 DIAGNOSIS — I509 Heart failure, unspecified: Secondary | ICD-10-CM | POA: Diagnosis not present

## 2014-08-30 DIAGNOSIS — I5032 Chronic diastolic (congestive) heart failure: Secondary | ICD-10-CM | POA: Diagnosis not present

## 2014-08-30 DIAGNOSIS — I4892 Unspecified atrial flutter: Secondary | ICD-10-CM | POA: Diagnosis not present

## 2014-09-14 DIAGNOSIS — I2510 Atherosclerotic heart disease of native coronary artery without angina pectoris: Secondary | ICD-10-CM | POA: Diagnosis not present

## 2014-09-14 DIAGNOSIS — I5043 Acute on chronic combined systolic (congestive) and diastolic (congestive) heart failure: Secondary | ICD-10-CM | POA: Diagnosis not present

## 2014-09-14 DIAGNOSIS — E1165 Type 2 diabetes mellitus with hyperglycemia: Secondary | ICD-10-CM | POA: Diagnosis not present

## 2014-09-14 DIAGNOSIS — I482 Chronic atrial fibrillation: Secondary | ICD-10-CM | POA: Diagnosis not present

## 2014-09-14 DIAGNOSIS — I10 Essential (primary) hypertension: Secondary | ICD-10-CM | POA: Diagnosis not present

## 2014-09-15 DIAGNOSIS — R609 Edema, unspecified: Secondary | ICD-10-CM | POA: Diagnosis not present

## 2014-09-15 DIAGNOSIS — N189 Chronic kidney disease, unspecified: Secondary | ICD-10-CM | POA: Diagnosis not present

## 2014-09-15 DIAGNOSIS — S8992XA Unspecified injury of left lower leg, initial encounter: Secondary | ICD-10-CM | POA: Diagnosis not present

## 2014-09-15 DIAGNOSIS — M25462 Effusion, left knee: Secondary | ICD-10-CM | POA: Diagnosis not present

## 2014-09-15 DIAGNOSIS — R0602 Shortness of breath: Secondary | ICD-10-CM | POA: Diagnosis not present

## 2014-09-15 DIAGNOSIS — M79605 Pain in left leg: Secondary | ICD-10-CM | POA: Diagnosis not present

## 2014-09-15 DIAGNOSIS — I4891 Unspecified atrial fibrillation: Secondary | ICD-10-CM | POA: Diagnosis not present

## 2014-09-15 DIAGNOSIS — T50904A Poisoning by unspecified drugs, medicaments and biological substances, undetermined, initial encounter: Secondary | ICD-10-CM | POA: Diagnosis not present

## 2014-09-15 DIAGNOSIS — I2510 Atherosclerotic heart disease of native coronary artery without angina pectoris: Secondary | ICD-10-CM | POA: Diagnosis not present

## 2014-09-15 DIAGNOSIS — M25562 Pain in left knee: Secondary | ICD-10-CM | POA: Diagnosis not present

## 2014-09-15 DIAGNOSIS — L03116 Cellulitis of left lower limb: Secondary | ICD-10-CM | POA: Diagnosis not present

## 2014-09-15 DIAGNOSIS — K219 Gastro-esophageal reflux disease without esophagitis: Secondary | ICD-10-CM | POA: Diagnosis not present

## 2014-09-15 DIAGNOSIS — S8002XA Contusion of left knee, initial encounter: Secondary | ICD-10-CM | POA: Diagnosis not present

## 2014-09-18 DIAGNOSIS — I10 Essential (primary) hypertension: Secondary | ICD-10-CM | POA: Diagnosis not present

## 2014-09-18 DIAGNOSIS — I509 Heart failure, unspecified: Secondary | ICD-10-CM | POA: Diagnosis not present

## 2014-09-18 DIAGNOSIS — E1165 Type 2 diabetes mellitus with hyperglycemia: Secondary | ICD-10-CM | POA: Diagnosis not present

## 2014-09-18 DIAGNOSIS — Z9889 Other specified postprocedural states: Secondary | ICD-10-CM | POA: Diagnosis not present

## 2014-09-18 DIAGNOSIS — L0390 Cellulitis, unspecified: Secondary | ICD-10-CM | POA: Diagnosis not present

## 2014-09-22 DIAGNOSIS — E1165 Type 2 diabetes mellitus with hyperglycemia: Secondary | ICD-10-CM | POA: Diagnosis not present

## 2014-09-22 DIAGNOSIS — I10 Essential (primary) hypertension: Secondary | ICD-10-CM | POA: Diagnosis not present

## 2014-09-22 DIAGNOSIS — I5043 Acute on chronic combined systolic (congestive) and diastolic (congestive) heart failure: Secondary | ICD-10-CM | POA: Diagnosis not present

## 2014-09-22 DIAGNOSIS — I482 Chronic atrial fibrillation: Secondary | ICD-10-CM | POA: Diagnosis not present

## 2014-09-22 DIAGNOSIS — I2510 Atherosclerotic heart disease of native coronary artery without angina pectoris: Secondary | ICD-10-CM | POA: Diagnosis not present

## 2014-09-23 DIAGNOSIS — I5043 Acute on chronic combined systolic (congestive) and diastolic (congestive) heart failure: Secondary | ICD-10-CM | POA: Diagnosis not present

## 2014-09-23 DIAGNOSIS — I482 Chronic atrial fibrillation: Secondary | ICD-10-CM | POA: Diagnosis not present

## 2014-09-23 DIAGNOSIS — E1165 Type 2 diabetes mellitus with hyperglycemia: Secondary | ICD-10-CM | POA: Diagnosis not present

## 2014-09-23 DIAGNOSIS — I2510 Atherosclerotic heart disease of native coronary artery without angina pectoris: Secondary | ICD-10-CM | POA: Diagnosis not present

## 2014-09-23 DIAGNOSIS — I10 Essential (primary) hypertension: Secondary | ICD-10-CM | POA: Diagnosis not present

## 2014-09-24 DIAGNOSIS — I5043 Acute on chronic combined systolic (congestive) and diastolic (congestive) heart failure: Secondary | ICD-10-CM | POA: Diagnosis not present

## 2014-09-24 DIAGNOSIS — I2510 Atherosclerotic heart disease of native coronary artery without angina pectoris: Secondary | ICD-10-CM | POA: Diagnosis not present

## 2014-09-24 DIAGNOSIS — I482 Chronic atrial fibrillation: Secondary | ICD-10-CM | POA: Diagnosis not present

## 2014-09-24 DIAGNOSIS — E1165 Type 2 diabetes mellitus with hyperglycemia: Secondary | ICD-10-CM | POA: Diagnosis not present

## 2014-09-24 DIAGNOSIS — I10 Essential (primary) hypertension: Secondary | ICD-10-CM | POA: Diagnosis not present

## 2014-09-25 DIAGNOSIS — I10 Essential (primary) hypertension: Secondary | ICD-10-CM | POA: Diagnosis not present

## 2014-09-25 DIAGNOSIS — I482 Chronic atrial fibrillation: Secondary | ICD-10-CM | POA: Diagnosis not present

## 2014-09-25 DIAGNOSIS — E1165 Type 2 diabetes mellitus with hyperglycemia: Secondary | ICD-10-CM | POA: Diagnosis not present

## 2014-09-25 DIAGNOSIS — I5043 Acute on chronic combined systolic (congestive) and diastolic (congestive) heart failure: Secondary | ICD-10-CM | POA: Diagnosis not present

## 2014-09-25 DIAGNOSIS — I2510 Atherosclerotic heart disease of native coronary artery without angina pectoris: Secondary | ICD-10-CM | POA: Diagnosis not present

## 2014-09-26 DIAGNOSIS — I10 Essential (primary) hypertension: Secondary | ICD-10-CM | POA: Diagnosis not present

## 2014-09-26 DIAGNOSIS — I482 Chronic atrial fibrillation: Secondary | ICD-10-CM | POA: Diagnosis not present

## 2014-09-26 DIAGNOSIS — I2510 Atherosclerotic heart disease of native coronary artery without angina pectoris: Secondary | ICD-10-CM | POA: Diagnosis not present

## 2014-09-26 DIAGNOSIS — I5043 Acute on chronic combined systolic (congestive) and diastolic (congestive) heart failure: Secondary | ICD-10-CM | POA: Diagnosis not present

## 2014-09-26 DIAGNOSIS — E1165 Type 2 diabetes mellitus with hyperglycemia: Secondary | ICD-10-CM | POA: Diagnosis not present

## 2014-09-28 DIAGNOSIS — E1165 Type 2 diabetes mellitus with hyperglycemia: Secondary | ICD-10-CM | POA: Diagnosis not present

## 2014-09-28 DIAGNOSIS — I10 Essential (primary) hypertension: Secondary | ICD-10-CM | POA: Diagnosis not present

## 2014-09-28 DIAGNOSIS — I2510 Atherosclerotic heart disease of native coronary artery without angina pectoris: Secondary | ICD-10-CM | POA: Diagnosis not present

## 2014-09-28 DIAGNOSIS — I482 Chronic atrial fibrillation: Secondary | ICD-10-CM | POA: Diagnosis not present

## 2014-09-28 DIAGNOSIS — I5043 Acute on chronic combined systolic (congestive) and diastolic (congestive) heart failure: Secondary | ICD-10-CM | POA: Diagnosis not present

## 2014-09-29 DIAGNOSIS — I5043 Acute on chronic combined systolic (congestive) and diastolic (congestive) heart failure: Secondary | ICD-10-CM | POA: Diagnosis not present

## 2014-09-29 DIAGNOSIS — I10 Essential (primary) hypertension: Secondary | ICD-10-CM | POA: Diagnosis not present

## 2014-09-29 DIAGNOSIS — I482 Chronic atrial fibrillation: Secondary | ICD-10-CM | POA: Diagnosis not present

## 2014-09-29 DIAGNOSIS — I2510 Atherosclerotic heart disease of native coronary artery without angina pectoris: Secondary | ICD-10-CM | POA: Diagnosis not present

## 2014-09-29 DIAGNOSIS — E1165 Type 2 diabetes mellitus with hyperglycemia: Secondary | ICD-10-CM | POA: Diagnosis not present

## 2014-10-01 DIAGNOSIS — E1165 Type 2 diabetes mellitus with hyperglycemia: Secondary | ICD-10-CM | POA: Diagnosis not present

## 2014-10-01 DIAGNOSIS — I482 Chronic atrial fibrillation: Secondary | ICD-10-CM | POA: Diagnosis not present

## 2014-10-01 DIAGNOSIS — I5043 Acute on chronic combined systolic (congestive) and diastolic (congestive) heart failure: Secondary | ICD-10-CM | POA: Diagnosis not present

## 2014-10-01 DIAGNOSIS — I2510 Atherosclerotic heart disease of native coronary artery without angina pectoris: Secondary | ICD-10-CM | POA: Diagnosis not present

## 2014-10-01 DIAGNOSIS — I10 Essential (primary) hypertension: Secondary | ICD-10-CM | POA: Diagnosis not present

## 2014-10-03 DIAGNOSIS — I482 Chronic atrial fibrillation: Secondary | ICD-10-CM | POA: Diagnosis not present

## 2014-10-03 DIAGNOSIS — I2510 Atherosclerotic heart disease of native coronary artery without angina pectoris: Secondary | ICD-10-CM | POA: Diagnosis not present

## 2014-10-03 DIAGNOSIS — I10 Essential (primary) hypertension: Secondary | ICD-10-CM | POA: Diagnosis not present

## 2014-10-03 DIAGNOSIS — E1165 Type 2 diabetes mellitus with hyperglycemia: Secondary | ICD-10-CM | POA: Diagnosis not present

## 2014-10-03 DIAGNOSIS — I5043 Acute on chronic combined systolic (congestive) and diastolic (congestive) heart failure: Secondary | ICD-10-CM | POA: Diagnosis not present

## 2014-10-04 DIAGNOSIS — I5043 Acute on chronic combined systolic (congestive) and diastolic (congestive) heart failure: Secondary | ICD-10-CM | POA: Diagnosis not present

## 2014-10-04 DIAGNOSIS — I10 Essential (primary) hypertension: Secondary | ICD-10-CM | POA: Diagnosis not present

## 2014-10-04 DIAGNOSIS — I2510 Atherosclerotic heart disease of native coronary artery without angina pectoris: Secondary | ICD-10-CM | POA: Diagnosis not present

## 2014-10-04 DIAGNOSIS — E1165 Type 2 diabetes mellitus with hyperglycemia: Secondary | ICD-10-CM | POA: Diagnosis not present

## 2014-10-04 DIAGNOSIS — I482 Chronic atrial fibrillation: Secondary | ICD-10-CM | POA: Diagnosis not present

## 2014-10-05 ENCOUNTER — Ambulatory Visit: Payer: Self-pay

## 2014-10-05 DIAGNOSIS — I5043 Acute on chronic combined systolic (congestive) and diastolic (congestive) heart failure: Secondary | ICD-10-CM | POA: Diagnosis not present

## 2014-10-05 DIAGNOSIS — I10 Essential (primary) hypertension: Secondary | ICD-10-CM | POA: Diagnosis not present

## 2014-10-05 DIAGNOSIS — E1165 Type 2 diabetes mellitus with hyperglycemia: Secondary | ICD-10-CM | POA: Diagnosis not present

## 2014-10-05 DIAGNOSIS — I482 Chronic atrial fibrillation: Secondary | ICD-10-CM | POA: Diagnosis not present

## 2014-10-05 DIAGNOSIS — I2510 Atherosclerotic heart disease of native coronary artery without angina pectoris: Secondary | ICD-10-CM | POA: Diagnosis not present

## 2014-10-08 DIAGNOSIS — E1165 Type 2 diabetes mellitus with hyperglycemia: Secondary | ICD-10-CM | POA: Diagnosis not present

## 2014-10-08 DIAGNOSIS — I482 Chronic atrial fibrillation: Secondary | ICD-10-CM | POA: Diagnosis not present

## 2014-10-08 DIAGNOSIS — I5043 Acute on chronic combined systolic (congestive) and diastolic (congestive) heart failure: Secondary | ICD-10-CM | POA: Diagnosis not present

## 2014-10-08 DIAGNOSIS — I10 Essential (primary) hypertension: Secondary | ICD-10-CM | POA: Diagnosis not present

## 2014-10-08 DIAGNOSIS — I2510 Atherosclerotic heart disease of native coronary artery without angina pectoris: Secondary | ICD-10-CM | POA: Diagnosis not present

## 2014-10-10 DIAGNOSIS — E1165 Type 2 diabetes mellitus with hyperglycemia: Secondary | ICD-10-CM | POA: Diagnosis not present

## 2014-10-10 DIAGNOSIS — I10 Essential (primary) hypertension: Secondary | ICD-10-CM | POA: Diagnosis not present

## 2014-10-10 DIAGNOSIS — I482 Chronic atrial fibrillation: Secondary | ICD-10-CM | POA: Diagnosis not present

## 2014-10-10 DIAGNOSIS — I5043 Acute on chronic combined systolic (congestive) and diastolic (congestive) heart failure: Secondary | ICD-10-CM | POA: Diagnosis not present

## 2014-10-10 DIAGNOSIS — I2510 Atherosclerotic heart disease of native coronary artery without angina pectoris: Secondary | ICD-10-CM | POA: Diagnosis not present

## 2014-10-11 DIAGNOSIS — E1165 Type 2 diabetes mellitus with hyperglycemia: Secondary | ICD-10-CM | POA: Diagnosis not present

## 2014-10-11 DIAGNOSIS — I10 Essential (primary) hypertension: Secondary | ICD-10-CM | POA: Diagnosis not present

## 2014-10-11 DIAGNOSIS — I482 Chronic atrial fibrillation: Secondary | ICD-10-CM | POA: Diagnosis not present

## 2014-10-11 DIAGNOSIS — I2510 Atherosclerotic heart disease of native coronary artery without angina pectoris: Secondary | ICD-10-CM | POA: Diagnosis not present

## 2014-10-11 DIAGNOSIS — I5043 Acute on chronic combined systolic (congestive) and diastolic (congestive) heart failure: Secondary | ICD-10-CM | POA: Diagnosis not present

## 2014-10-15 DIAGNOSIS — E1165 Type 2 diabetes mellitus with hyperglycemia: Secondary | ICD-10-CM | POA: Diagnosis not present

## 2014-10-15 DIAGNOSIS — I509 Heart failure, unspecified: Secondary | ICD-10-CM | POA: Diagnosis not present

## 2014-10-15 DIAGNOSIS — I10 Essential (primary) hypertension: Secondary | ICD-10-CM | POA: Diagnosis not present

## 2014-10-15 DIAGNOSIS — I2510 Atherosclerotic heart disease of native coronary artery without angina pectoris: Secondary | ICD-10-CM | POA: Diagnosis not present

## 2014-10-15 DIAGNOSIS — I482 Chronic atrial fibrillation: Secondary | ICD-10-CM | POA: Diagnosis not present

## 2014-10-15 DIAGNOSIS — I5043 Acute on chronic combined systolic (congestive) and diastolic (congestive) heart failure: Secondary | ICD-10-CM | POA: Diagnosis not present

## 2014-10-16 DIAGNOSIS — M25511 Pain in right shoulder: Secondary | ICD-10-CM | POA: Diagnosis not present

## 2014-10-17 DIAGNOSIS — I5043 Acute on chronic combined systolic (congestive) and diastolic (congestive) heart failure: Secondary | ICD-10-CM | POA: Diagnosis not present

## 2014-10-17 DIAGNOSIS — E1165 Type 2 diabetes mellitus with hyperglycemia: Secondary | ICD-10-CM | POA: Diagnosis not present

## 2014-10-17 DIAGNOSIS — I482 Chronic atrial fibrillation: Secondary | ICD-10-CM | POA: Diagnosis not present

## 2014-10-17 DIAGNOSIS — I10 Essential (primary) hypertension: Secondary | ICD-10-CM | POA: Diagnosis not present

## 2014-10-17 DIAGNOSIS — I2510 Atherosclerotic heart disease of native coronary artery without angina pectoris: Secondary | ICD-10-CM | POA: Diagnosis not present

## 2014-10-18 DIAGNOSIS — I10 Essential (primary) hypertension: Secondary | ICD-10-CM | POA: Diagnosis not present

## 2014-10-18 DIAGNOSIS — E1165 Type 2 diabetes mellitus with hyperglycemia: Secondary | ICD-10-CM | POA: Diagnosis not present

## 2014-10-18 DIAGNOSIS — I482 Chronic atrial fibrillation: Secondary | ICD-10-CM | POA: Diagnosis not present

## 2014-10-18 DIAGNOSIS — I5043 Acute on chronic combined systolic (congestive) and diastolic (congestive) heart failure: Secondary | ICD-10-CM | POA: Diagnosis not present

## 2014-10-18 DIAGNOSIS — I2510 Atherosclerotic heart disease of native coronary artery without angina pectoris: Secondary | ICD-10-CM | POA: Diagnosis not present

## 2014-10-19 DIAGNOSIS — I5043 Acute on chronic combined systolic (congestive) and diastolic (congestive) heart failure: Secondary | ICD-10-CM | POA: Diagnosis not present

## 2014-10-19 DIAGNOSIS — I10 Essential (primary) hypertension: Secondary | ICD-10-CM | POA: Diagnosis not present

## 2014-10-19 DIAGNOSIS — E1165 Type 2 diabetes mellitus with hyperglycemia: Secondary | ICD-10-CM | POA: Diagnosis not present

## 2014-10-19 DIAGNOSIS — I2510 Atherosclerotic heart disease of native coronary artery without angina pectoris: Secondary | ICD-10-CM | POA: Diagnosis not present

## 2014-10-19 DIAGNOSIS — I482 Chronic atrial fibrillation: Secondary | ICD-10-CM | POA: Diagnosis not present

## 2014-10-22 DIAGNOSIS — I5043 Acute on chronic combined systolic (congestive) and diastolic (congestive) heart failure: Secondary | ICD-10-CM | POA: Diagnosis not present

## 2014-10-22 DIAGNOSIS — I482 Chronic atrial fibrillation: Secondary | ICD-10-CM | POA: Diagnosis not present

## 2014-10-22 DIAGNOSIS — E1165 Type 2 diabetes mellitus with hyperglycemia: Secondary | ICD-10-CM | POA: Diagnosis not present

## 2014-10-22 DIAGNOSIS — I10 Essential (primary) hypertension: Secondary | ICD-10-CM | POA: Diagnosis not present

## 2014-10-22 DIAGNOSIS — I2510 Atherosclerotic heart disease of native coronary artery without angina pectoris: Secondary | ICD-10-CM | POA: Diagnosis not present

## 2014-10-23 DIAGNOSIS — E1165 Type 2 diabetes mellitus with hyperglycemia: Secondary | ICD-10-CM | POA: Diagnosis not present

## 2014-10-23 DIAGNOSIS — I10 Essential (primary) hypertension: Secondary | ICD-10-CM | POA: Diagnosis not present

## 2014-10-23 DIAGNOSIS — I5043 Acute on chronic combined systolic (congestive) and diastolic (congestive) heart failure: Secondary | ICD-10-CM | POA: Diagnosis not present

## 2014-10-23 DIAGNOSIS — E119 Type 2 diabetes mellitus without complications: Secondary | ICD-10-CM | POA: Diagnosis not present

## 2014-10-23 DIAGNOSIS — L609 Nail disorder, unspecified: Secondary | ICD-10-CM | POA: Diagnosis not present

## 2014-10-23 DIAGNOSIS — I482 Chronic atrial fibrillation: Secondary | ICD-10-CM | POA: Diagnosis not present

## 2014-10-23 DIAGNOSIS — I2510 Atherosclerotic heart disease of native coronary artery without angina pectoris: Secondary | ICD-10-CM | POA: Diagnosis not present

## 2014-10-24 DIAGNOSIS — I2510 Atherosclerotic heart disease of native coronary artery without angina pectoris: Secondary | ICD-10-CM | POA: Diagnosis not present

## 2014-10-24 DIAGNOSIS — I5043 Acute on chronic combined systolic (congestive) and diastolic (congestive) heart failure: Secondary | ICD-10-CM | POA: Diagnosis not present

## 2014-10-24 DIAGNOSIS — E1165 Type 2 diabetes mellitus with hyperglycemia: Secondary | ICD-10-CM | POA: Diagnosis not present

## 2014-10-24 DIAGNOSIS — I482 Chronic atrial fibrillation: Secondary | ICD-10-CM | POA: Diagnosis not present

## 2014-10-24 DIAGNOSIS — I10 Essential (primary) hypertension: Secondary | ICD-10-CM | POA: Diagnosis not present

## 2014-10-25 DIAGNOSIS — D649 Anemia, unspecified: Secondary | ICD-10-CM | POA: Diagnosis not present

## 2014-10-25 DIAGNOSIS — I482 Chronic atrial fibrillation: Secondary | ICD-10-CM | POA: Diagnosis not present

## 2014-10-25 DIAGNOSIS — I10 Essential (primary) hypertension: Secondary | ICD-10-CM | POA: Diagnosis not present

## 2014-10-25 DIAGNOSIS — Z1389 Encounter for screening for other disorder: Secondary | ICD-10-CM | POA: Diagnosis not present

## 2014-10-25 DIAGNOSIS — I2510 Atherosclerotic heart disease of native coronary artery without angina pectoris: Secondary | ICD-10-CM | POA: Diagnosis not present

## 2014-10-25 DIAGNOSIS — E1165 Type 2 diabetes mellitus with hyperglycemia: Secondary | ICD-10-CM | POA: Diagnosis not present

## 2014-10-25 DIAGNOSIS — I5043 Acute on chronic combined systolic (congestive) and diastolic (congestive) heart failure: Secondary | ICD-10-CM | POA: Diagnosis not present

## 2014-10-25 DIAGNOSIS — Z0000 Encounter for general adult medical examination without abnormal findings: Secondary | ICD-10-CM | POA: Diagnosis not present

## 2014-10-26 DIAGNOSIS — I5043 Acute on chronic combined systolic (congestive) and diastolic (congestive) heart failure: Secondary | ICD-10-CM | POA: Diagnosis not present

## 2014-10-26 DIAGNOSIS — E1165 Type 2 diabetes mellitus with hyperglycemia: Secondary | ICD-10-CM | POA: Diagnosis not present

## 2014-10-26 DIAGNOSIS — I2510 Atherosclerotic heart disease of native coronary artery without angina pectoris: Secondary | ICD-10-CM | POA: Diagnosis not present

## 2014-10-26 DIAGNOSIS — I482 Chronic atrial fibrillation: Secondary | ICD-10-CM | POA: Diagnosis not present

## 2014-10-26 DIAGNOSIS — I10 Essential (primary) hypertension: Secondary | ICD-10-CM | POA: Diagnosis not present

## 2014-10-29 DIAGNOSIS — I482 Chronic atrial fibrillation: Secondary | ICD-10-CM | POA: Diagnosis not present

## 2014-10-29 DIAGNOSIS — I2510 Atherosclerotic heart disease of native coronary artery without angina pectoris: Secondary | ICD-10-CM | POA: Diagnosis not present

## 2014-10-29 DIAGNOSIS — I10 Essential (primary) hypertension: Secondary | ICD-10-CM | POA: Diagnosis not present

## 2014-10-29 DIAGNOSIS — I5043 Acute on chronic combined systolic (congestive) and diastolic (congestive) heart failure: Secondary | ICD-10-CM | POA: Diagnosis not present

## 2014-10-29 DIAGNOSIS — E1165 Type 2 diabetes mellitus with hyperglycemia: Secondary | ICD-10-CM | POA: Diagnosis not present

## 2014-10-30 DIAGNOSIS — I2510 Atherosclerotic heart disease of native coronary artery without angina pectoris: Secondary | ICD-10-CM | POA: Diagnosis not present

## 2014-10-30 DIAGNOSIS — I10 Essential (primary) hypertension: Secondary | ICD-10-CM | POA: Diagnosis not present

## 2014-10-30 DIAGNOSIS — I482 Chronic atrial fibrillation: Secondary | ICD-10-CM | POA: Diagnosis not present

## 2014-10-30 DIAGNOSIS — E1165 Type 2 diabetes mellitus with hyperglycemia: Secondary | ICD-10-CM | POA: Diagnosis not present

## 2014-10-30 DIAGNOSIS — I5043 Acute on chronic combined systolic (congestive) and diastolic (congestive) heart failure: Secondary | ICD-10-CM | POA: Diagnosis not present

## 2014-11-01 DIAGNOSIS — I10 Essential (primary) hypertension: Secondary | ICD-10-CM | POA: Diagnosis not present

## 2014-11-01 DIAGNOSIS — I482 Chronic atrial fibrillation: Secondary | ICD-10-CM | POA: Diagnosis not present

## 2014-11-01 DIAGNOSIS — I2510 Atherosclerotic heart disease of native coronary artery without angina pectoris: Secondary | ICD-10-CM | POA: Diagnosis not present

## 2014-11-01 DIAGNOSIS — E1165 Type 2 diabetes mellitus with hyperglycemia: Secondary | ICD-10-CM | POA: Diagnosis not present

## 2014-11-01 DIAGNOSIS — I5043 Acute on chronic combined systolic (congestive) and diastolic (congestive) heart failure: Secondary | ICD-10-CM | POA: Diagnosis not present

## 2014-11-02 DIAGNOSIS — I10 Essential (primary) hypertension: Secondary | ICD-10-CM | POA: Diagnosis not present

## 2014-11-02 DIAGNOSIS — I2510 Atherosclerotic heart disease of native coronary artery without angina pectoris: Secondary | ICD-10-CM | POA: Diagnosis not present

## 2014-11-02 DIAGNOSIS — I5043 Acute on chronic combined systolic (congestive) and diastolic (congestive) heart failure: Secondary | ICD-10-CM | POA: Diagnosis not present

## 2014-11-02 DIAGNOSIS — E1165 Type 2 diabetes mellitus with hyperglycemia: Secondary | ICD-10-CM | POA: Diagnosis not present

## 2014-11-02 DIAGNOSIS — I482 Chronic atrial fibrillation: Secondary | ICD-10-CM | POA: Diagnosis not present

## 2014-11-05 DIAGNOSIS — I2510 Atherosclerotic heart disease of native coronary artery without angina pectoris: Secondary | ICD-10-CM | POA: Diagnosis not present

## 2014-11-05 DIAGNOSIS — E1165 Type 2 diabetes mellitus with hyperglycemia: Secondary | ICD-10-CM | POA: Diagnosis not present

## 2014-11-05 DIAGNOSIS — I10 Essential (primary) hypertension: Secondary | ICD-10-CM | POA: Diagnosis not present

## 2014-11-05 DIAGNOSIS — I482 Chronic atrial fibrillation: Secondary | ICD-10-CM | POA: Diagnosis not present

## 2014-11-05 DIAGNOSIS — I5043 Acute on chronic combined systolic (congestive) and diastolic (congestive) heart failure: Secondary | ICD-10-CM | POA: Diagnosis not present

## 2014-11-06 DIAGNOSIS — I482 Chronic atrial fibrillation: Secondary | ICD-10-CM | POA: Diagnosis not present

## 2014-11-06 DIAGNOSIS — I2510 Atherosclerotic heart disease of native coronary artery without angina pectoris: Secondary | ICD-10-CM | POA: Diagnosis not present

## 2014-11-06 DIAGNOSIS — I10 Essential (primary) hypertension: Secondary | ICD-10-CM | POA: Diagnosis not present

## 2014-11-06 DIAGNOSIS — I5043 Acute on chronic combined systolic (congestive) and diastolic (congestive) heart failure: Secondary | ICD-10-CM | POA: Diagnosis not present

## 2014-11-06 DIAGNOSIS — E1165 Type 2 diabetes mellitus with hyperglycemia: Secondary | ICD-10-CM | POA: Diagnosis not present

## 2014-11-07 DIAGNOSIS — E1165 Type 2 diabetes mellitus with hyperglycemia: Secondary | ICD-10-CM | POA: Diagnosis not present

## 2014-11-07 DIAGNOSIS — I482 Chronic atrial fibrillation: Secondary | ICD-10-CM | POA: Diagnosis not present

## 2014-11-07 DIAGNOSIS — I10 Essential (primary) hypertension: Secondary | ICD-10-CM | POA: Diagnosis not present

## 2014-11-07 DIAGNOSIS — I2510 Atherosclerotic heart disease of native coronary artery without angina pectoris: Secondary | ICD-10-CM | POA: Diagnosis not present

## 2014-11-07 DIAGNOSIS — I5043 Acute on chronic combined systolic (congestive) and diastolic (congestive) heart failure: Secondary | ICD-10-CM | POA: Diagnosis not present

## 2014-11-22 DIAGNOSIS — Z9181 History of falling: Secondary | ICD-10-CM | POA: Diagnosis not present

## 2014-11-22 DIAGNOSIS — I10 Essential (primary) hypertension: Secondary | ICD-10-CM | POA: Diagnosis not present

## 2014-11-22 DIAGNOSIS — Z1389 Encounter for screening for other disorder: Secondary | ICD-10-CM | POA: Diagnosis not present

## 2014-11-23 DIAGNOSIS — M25511 Pain in right shoulder: Secondary | ICD-10-CM | POA: Diagnosis not present

## 2014-11-26 DIAGNOSIS — M25511 Pain in right shoulder: Secondary | ICD-10-CM | POA: Diagnosis not present

## 2014-11-27 DIAGNOSIS — M25511 Pain in right shoulder: Secondary | ICD-10-CM | POA: Diagnosis not present

## 2014-11-30 DIAGNOSIS — M25511 Pain in right shoulder: Secondary | ICD-10-CM | POA: Diagnosis not present

## 2014-12-03 DIAGNOSIS — M25511 Pain in right shoulder: Secondary | ICD-10-CM | POA: Diagnosis not present

## 2014-12-07 DIAGNOSIS — M25511 Pain in right shoulder: Secondary | ICD-10-CM | POA: Diagnosis not present

## 2014-12-11 DIAGNOSIS — M25511 Pain in right shoulder: Secondary | ICD-10-CM | POA: Diagnosis not present

## 2014-12-14 DIAGNOSIS — M25511 Pain in right shoulder: Secondary | ICD-10-CM | POA: Diagnosis not present

## 2014-12-18 DIAGNOSIS — M25511 Pain in right shoulder: Secondary | ICD-10-CM | POA: Diagnosis not present

## 2014-12-21 DIAGNOSIS — M25511 Pain in right shoulder: Secondary | ICD-10-CM | POA: Diagnosis not present

## 2014-12-25 DIAGNOSIS — M25511 Pain in right shoulder: Secondary | ICD-10-CM | POA: Diagnosis not present

## 2014-12-28 DIAGNOSIS — M25511 Pain in right shoulder: Secondary | ICD-10-CM | POA: Diagnosis not present

## 2015-01-01 DIAGNOSIS — M25511 Pain in right shoulder: Secondary | ICD-10-CM | POA: Diagnosis not present

## 2015-01-02 DIAGNOSIS — M25511 Pain in right shoulder: Secondary | ICD-10-CM | POA: Diagnosis not present

## 2015-01-04 DIAGNOSIS — M25511 Pain in right shoulder: Secondary | ICD-10-CM | POA: Diagnosis not present

## 2015-01-08 DIAGNOSIS — M25511 Pain in right shoulder: Secondary | ICD-10-CM | POA: Diagnosis not present

## 2015-01-11 DIAGNOSIS — M25511 Pain in right shoulder: Secondary | ICD-10-CM | POA: Diagnosis not present

## 2015-01-15 DIAGNOSIS — M25511 Pain in right shoulder: Secondary | ICD-10-CM | POA: Diagnosis not present

## 2015-01-18 DIAGNOSIS — M25511 Pain in right shoulder: Secondary | ICD-10-CM | POA: Diagnosis not present

## 2015-01-22 DIAGNOSIS — M25511 Pain in right shoulder: Secondary | ICD-10-CM | POA: Diagnosis not present

## 2015-01-25 DIAGNOSIS — M25511 Pain in right shoulder: Secondary | ICD-10-CM | POA: Diagnosis not present

## 2015-01-27 ENCOUNTER — Inpatient Hospital Stay (HOSPITAL_COMMUNITY): Payer: Medicare Other

## 2015-01-27 ENCOUNTER — Encounter (HOSPITAL_COMMUNITY): Payer: Self-pay | Admitting: *Deleted

## 2015-01-27 ENCOUNTER — Inpatient Hospital Stay (HOSPITAL_COMMUNITY)
Admission: AD | Admit: 2015-01-27 | Discharge: 2015-01-29 | DRG: 087 | Disposition: A | Discharge status: Home / Self Care | Payer: Medicare Other | Source: Other Acute Inpatient Hospital | Attending: Neurology | Admitting: Neurology

## 2015-01-27 DIAGNOSIS — Z981 Arthrodesis status: Secondary | ICD-10-CM | POA: Diagnosis not present

## 2015-01-27 DIAGNOSIS — Z9049 Acquired absence of other specified parts of digestive tract: Secondary | ICD-10-CM | POA: Diagnosis present

## 2015-01-27 DIAGNOSIS — S066X0A Traumatic subarachnoid hemorrhage without loss of consciousness, initial encounter: Principal | ICD-10-CM | POA: Diagnosis present

## 2015-01-27 DIAGNOSIS — S0990XD Unspecified injury of head, subsequent encounter: Secondary | ICD-10-CM

## 2015-01-27 DIAGNOSIS — Z79899 Other long term (current) drug therapy: Secondary | ICD-10-CM

## 2015-01-27 DIAGNOSIS — I4891 Unspecified atrial fibrillation: Secondary | ICD-10-CM | POA: Diagnosis present

## 2015-01-27 DIAGNOSIS — D696 Thrombocytopenia, unspecified: Secondary | ICD-10-CM | POA: Diagnosis present

## 2015-01-27 DIAGNOSIS — I10 Essential (primary) hypertension: Secondary | ICD-10-CM | POA: Diagnosis present

## 2015-01-27 DIAGNOSIS — E119 Type 2 diabetes mellitus without complications: Secondary | ICD-10-CM | POA: Diagnosis present

## 2015-01-27 DIAGNOSIS — T148 Other injury of unspecified body region: Secondary | ICD-10-CM | POA: Diagnosis not present

## 2015-01-27 DIAGNOSIS — W06XXXA Fall from bed, initial encounter: Secondary | ICD-10-CM | POA: Diagnosis present

## 2015-01-27 DIAGNOSIS — I609 Nontraumatic subarachnoid hemorrhage, unspecified: Secondary | ICD-10-CM

## 2015-01-27 DIAGNOSIS — E860 Dehydration: Secondary | ICD-10-CM | POA: Diagnosis present

## 2015-01-27 DIAGNOSIS — S0101XA Laceration without foreign body of scalp, initial encounter: Secondary | ICD-10-CM | POA: Diagnosis present

## 2015-01-27 DIAGNOSIS — I509 Heart failure, unspecified: Secondary | ICD-10-CM | POA: Diagnosis not present

## 2015-01-27 DIAGNOSIS — E785 Hyperlipidemia, unspecified: Secondary | ICD-10-CM | POA: Diagnosis present

## 2015-01-27 DIAGNOSIS — R51 Headache: Secondary | ICD-10-CM | POA: Diagnosis not present

## 2015-01-27 DIAGNOSIS — M5432 Sciatica, left side: Secondary | ICD-10-CM | POA: Diagnosis present

## 2015-01-27 DIAGNOSIS — Z9181 History of falling: Secondary | ICD-10-CM

## 2015-01-27 DIAGNOSIS — Z7901 Long term (current) use of anticoagulants: Secondary | ICD-10-CM | POA: Diagnosis not present

## 2015-01-27 DIAGNOSIS — Z8673 Personal history of transient ischemic attack (TIA), and cerebral infarction without residual deficits: Secondary | ICD-10-CM | POA: Diagnosis not present

## 2015-01-27 DIAGNOSIS — S0990XA Unspecified injury of head, initial encounter: Secondary | ICD-10-CM | POA: Diagnosis present

## 2015-01-27 DIAGNOSIS — Y92092 Bedroom in other non-institutional residence as the place of occurrence of the external cause: Secondary | ICD-10-CM | POA: Diagnosis not present

## 2015-01-27 DIAGNOSIS — R569 Unspecified convulsions: Secondary | ICD-10-CM | POA: Diagnosis not present

## 2015-01-27 LAB — GLUCOSE, CAPILLARY
GLUCOSE-CAPILLARY: 155 mg/dL — AB (ref 65–99)
GLUCOSE-CAPILLARY: 160 mg/dL — AB (ref 65–99)
Glucose-Capillary: 211 mg/dL — ABNORMAL HIGH (ref 65–99)
Glucose-Capillary: 161 mg/dL — ABNORMAL HIGH (ref 65–99)
Glucose-Capillary: 119 mg/dL — ABNORMAL HIGH (ref 65–99)

## 2015-01-27 LAB — MRSA PCR SCREENING: MRSA by PCR: POSITIVE — AB

## 2015-01-27 MED ORDER — ACETAMINOPHEN 325 MG PO TABS: 650.0000 mg | ORAL_TABLET | ORAL | Status: DC | PRN

## 2015-01-27 MED ORDER — CHLORHEXIDINE GLUCONATE CLOTH 2 % EX PADS
6.0000 | MEDICATED_PAD | Freq: Every day | CUTANEOUS | Status: DC
  Administered 2015-01-27 – 2015-01-29 (×3): 6 via TOPICAL

## 2015-01-27 MED ORDER — POLYETHYLENE GLYCOL 3350 17 G PO PACK
17.0000 g | PACK | Freq: Every day | ORAL | Status: DC
  Filled 2015-01-27 (×3): qty 1

## 2015-01-27 MED ORDER — ACETAMINOPHEN 650 MG RE SUPP: 650.0000 mg | RECTAL | Status: DC | PRN

## 2015-01-27 MED ORDER — DIGOXIN 125 MCG PO TABS
0.1250 mg | ORAL_TABLET | Freq: Every day | ORAL | Status: DC
  Administered 2015-01-28: 0.125 mg via ORAL
  Filled 2015-01-27 (×3): qty 1

## 2015-01-27 MED ORDER — AMLODIPINE BESYLATE 10 MG PO TABS: 10.0000 mg | ORAL_TABLET | Freq: Every evening | ORAL | Status: DC

## 2015-01-27 MED ORDER — TRAZODONE HCL 50 MG PO TABS: 50.0000 mg | ORAL_TABLET | Freq: Every evening | ORAL | Status: DC | PRN

## 2015-01-27 MED ORDER — INSULIN ASPART 100 UNIT/ML ~~LOC~~ SOLN
0.0000 [IU] | Freq: Three times a day (TID) | SUBCUTANEOUS | Status: DC
  Administered 2015-01-27 (×2): 3 [IU] via SUBCUTANEOUS
  Administered 2015-01-27 – 2015-01-28 (×2): 5 [IU] via SUBCUTANEOUS
  Administered 2015-01-28: 3 [IU] via SUBCUTANEOUS
  Administered 2015-01-28: 5 [IU] via SUBCUTANEOUS
  Administered 2015-01-29: 3 [IU] via SUBCUTANEOUS

## 2015-01-27 MED ORDER — CARVEDILOL 12.5 MG PO TABS
12.5000 mg | ORAL_TABLET | Freq: Two times a day (BID) | ORAL | Status: DC
  Administered 2015-01-27 – 2015-01-28 (×3): 12.5 mg via ORAL
  Filled 2015-01-27 (×7): qty 1

## 2015-01-27 MED ORDER — ADULT MULTIVITAMIN W/MINERALS CH
1.0000 | ORAL_TABLET | Freq: Every morning | ORAL | Status: DC
  Administered 2015-01-27 – 2015-01-29 (×3): 1 via ORAL
  Filled 2015-01-27 (×3): qty 1

## 2015-01-27 MED ORDER — FUROSEMIDE 40 MG PO TABS
40.0000 mg | ORAL_TABLET | Freq: Every day | ORAL | Status: DC
  Filled 2015-01-27: qty 1

## 2015-01-27 MED ORDER — MUPIROCIN 2 % EX OINT
1.0000 "application " | TOPICAL_OINTMENT | Freq: Two times a day (BID) | CUTANEOUS | Status: DC
  Administered 2015-01-27 – 2015-01-29 (×5): 1 via NASAL
  Filled 2015-01-27 (×2): qty 22

## 2015-01-27 MED ORDER — FUROSEMIDE 80 MG PO TABS
80.0000 mg | ORAL_TABLET | Freq: Two times a day (BID) | ORAL | Status: DC
  Administered 2015-01-27 – 2015-01-29 (×4): 80 mg via ORAL
  Filled 2015-01-27 (×6): qty 1

## 2015-01-27 MED ORDER — GABAPENTIN 100 MG PO CAPS
200.0000 mg | ORAL_CAPSULE | Freq: Two times a day (BID) | ORAL | Status: DC
  Administered 2015-01-27 – 2015-01-29 (×5): 200 mg via ORAL
  Filled 2015-01-27 (×6): qty 2

## 2015-01-27 MED ORDER — METHOCARBAMOL 750 MG PO TABS
750.0000 mg | ORAL_TABLET | Freq: Four times a day (QID) | ORAL | Status: DC
  Administered 2015-01-27 (×3): 750 mg via ORAL
  Filled 2015-01-27 (×11): qty 1

## 2015-01-27 MED ORDER — STROKE: EARLY STAGES OF RECOVERY BOOK
Freq: Once | Status: DC
  Filled 2015-01-27 (×2): qty 1

## 2015-01-27 MED ORDER — SENNOSIDES-DOCUSATE SODIUM 8.6-50 MG PO TABS
2.0000 | ORAL_TABLET | Freq: Two times a day (BID) | ORAL | Status: DC
  Administered 2015-01-27: 2 via ORAL
  Filled 2015-01-27 (×6): qty 2

## 2015-01-27 MED ORDER — LINAGLIPTIN 5 MG PO TABS
5.0000 mg | ORAL_TABLET | Freq: Every day | ORAL | Status: DC
  Administered 2015-01-27 – 2015-01-29 (×3): 5 mg via ORAL
  Filled 2015-01-27 (×3): qty 1

## 2015-01-27 MED ORDER — PANTOPRAZOLE SODIUM 40 MG IV SOLR
40.0000 mg | Freq: Every day | INTRAVENOUS | Status: DC
  Administered 2015-01-27: 40 mg via INTRAVENOUS
  Filled 2015-01-27: qty 40

## 2015-01-27 MED ORDER — GLIMEPIRIDE 4 MG PO TABS
4.0000 mg | ORAL_TABLET | Freq: Every day | ORAL | Status: DC
  Administered 2015-01-27 – 2015-01-29 (×3): 4 mg via ORAL
  Filled 2015-01-27 (×4): qty 1

## 2015-01-27 MED ORDER — PANTOPRAZOLE SODIUM 40 MG PO TBEC
40.0000 mg | DELAYED_RELEASE_TABLET | Freq: Every day | ORAL | Status: DC
  Administered 2015-01-28: 40 mg via ORAL
  Filled 2015-01-27: qty 1

## 2015-01-27 MED ORDER — TRAMADOL-ACETAMINOPHEN 37.5-325 MG PO TABS: 2.0000 | ORAL_TABLET | Freq: Three times a day (TID) | ORAL | Status: DC
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
## 2015-01-28 ENCOUNTER — Inpatient Hospital Stay (HOSPITAL_COMMUNITY): Payer: Medicare Other

## 2015-01-28 DIAGNOSIS — S0990XA Unspecified injury of head, initial encounter: Secondary | ICD-10-CM | POA: Diagnosis present

## 2015-01-28 DIAGNOSIS — R569 Unspecified convulsions: Secondary | ICD-10-CM

## 2015-01-28 LAB — GLUCOSE, CAPILLARY
GLUCOSE-CAPILLARY: 162 mg/dL — AB (ref 65–99)
Glucose-Capillary: 184 mg/dL — ABNORMAL HIGH (ref 65–99)
Glucose-Capillary: 228 mg/dL — ABNORMAL HIGH (ref 65–99)
Glucose-Capillary: 232 mg/dL — ABNORMAL HIGH (ref 65–99)

## 2015-01-28 LAB — COMPREHENSIVE METABOLIC PANEL
ALBUMIN: 3.4 g/dL — AB (ref 3.5–5.0)
ALK PHOS: 89 U/L (ref 38–126)
ALT: 11 U/L — ABNORMAL LOW (ref 17–63)
ANION GAP: 9 (ref 5–15)
AST: 17 U/L (ref 15–41)
BUN: 25 mg/dL — ABNORMAL HIGH (ref 6–20)
CO2: 32 mmol/L (ref 22–32)
Calcium: 9.6 mg/dL (ref 8.9–10.3)
Chloride: 96 mmol/L — ABNORMAL LOW (ref 101–111)
Creatinine, Ser: 1.63 mg/dL — ABNORMAL HIGH (ref 0.61–1.24)
GFR, EST AFRICAN AMERICAN: 47 mL/min — AB (ref >60)
GFR, EST NON AFRICAN AMERICAN: 40 mL/min — AB (ref >60)
Glucose, Bld: 120 mg/dL — ABNORMAL HIGH (ref 65–99)
POTASSIUM: 3.7 mmol/L (ref 3.5–5.1)
Sodium: 137 mmol/L (ref 135–145)
Total Bilirubin: 0.8 mg/dL (ref 0.3–1.2)
Total Protein: 6.4 g/dL — ABNORMAL LOW (ref 6.5–8.1)

## 2015-01-28 LAB — CBC
HCT: 37.5 % — ABNORMAL LOW (ref 39.0–52.0)
Hemoglobin: 12.8 g/dL — ABNORMAL LOW (ref 13.0–17.0)
MCH: 32.2 pg (ref 26.0–34.0)
MCHC: 34.1 g/dL (ref 30.0–36.0)
MCV: 94.2 fL (ref 78.0–100.0)
PLATELETS: 132 10*3/uL — AB (ref 150–400)
RBC: 3.98 MIL/uL — AB (ref 4.22–5.81)
RDW: 11.7 % (ref 11.5–15.5)
WBC: 7.9 10*3/uL (ref 4.0–10.5)

## 2015-01-28 LAB — HEMOGLOBIN A1C
Hgb A1c MFr Bld: 10.2 % — ABNORMAL HIGH (ref 4.8–5.6)
Mean Plasma Glucose: 246 mg/dL

## 2015-01-28 MED ORDER — ASPIRIN 81 MG PO CHEW
81.0000 mg | CHEWABLE_TABLET | Freq: Every day | ORAL | Status: DC
  Administered 2015-01-28 – 2015-01-29 (×2): 81 mg via ORAL
  Filled 2015-01-28 (×2): qty 1

## 2015-01-28 MED ORDER — ASPIRIN 81 MG PO CHEW: 81.0000 mg | CHEWABLE_TABLET | Freq: Every day | ORAL | Status: DC

## 2015-01-28 NOTE — Procedures (Signed)
EEG report.  Brief clinical history:  74 year old male s/p fall from bed with small SAH. 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 Although patient reports nightmares, with further questioning it is unclear why he actually fell out of bed and patient is amnestic of the event. Can not rule out seizure.  Technique: this is a 17 channel routine scalp EEG performed at the bedside with bipolar and monopolar montages arranged in accordance to the international 10/20 system of electrode placement. One channel was dedicated to EKG recording.  The study was performed during wakefulness, drowsiness, and stage 2 sleep. No activating procedures performed.  Description:In the wakeful state, the best background consisted of a medium amplitude, posterior dominant, well sustained, symmetric and reactive 9 Hz rhythm. Drowsiness demonstrated dropout of the alpha rhythm. Stage 2 sleep showed symmetric and synchronous sleep spindles without intermixed epileptiform discharges. No focal or generalized epileptiform discharges noted.  No pathologic areas of slowing seen.  EKG showed sinus rhythm.  Impression: this is a normal awake and asleep EEG. Please, be aware that a normal EEG does not exclude the possibility of epilepsy.  Clinical correlation is advised.   Dorian Pod, MD [facility]

## 2015-01-28 NOTE — Progress Notes (Signed)
OT Cancellation Note  Patient Details Name: Gabriel Riley MRN: [id number] DOB: [birth year]   Cancelled Treatment:    Reason Eval/Treat Not Completed: Other (comment). Pt currently on bedrest.  Almon Register [phone] 01/28/2015, 8:07 AM

## 2015-01-28 NOTE — Care Management Note (Signed)
Case Management Note  Patient Details  Name: TAMON PARKERSON MRN: [id number] Date of Birth: [birth year]  Subjective/Objective:   Pt admitted on 01/27/15 after falling out of bed and hitting his head, resulting in [facility].  PTA, pt resided at a skilled nursing facility.                   Action/Plan: Clinical social worker consulted to facilitate return to SNF when medically stable.  Will follow progress.    Expected Discharge Date:  02/01/15               Expected Discharge Plan:  Skilled Nursing Facility  In-House Referral:  Clinical Social Work  Discharge planning Services  CM Consult  Post Acute Care Choice:    Choice offered to:     DME Arranged:    DME Agency:     HH Arranged:    [place] Agency:     Status of Service:  In process, will continue to follow  Medicare Important Message Given:    Date Medicare IM Given:    Medicare IM give by:    Date Additional Medicare IM Given:    Additional Medicare Important Message give by:     If discussed at [place] of Stay Meetings, dates discussed:    Additional Comments:  Reinaldo Raddle, RN, BSN  Trauma/Neuro ICU Case Manager [phone]
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
## 2015-01-28 NOTE — Progress Notes (Signed)
Bedside EEG completed, results pending. 
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
## 2015-01-28 NOTE — Progress Notes (Signed)
Pt arrived to unit at 14:20pm alert, verbal with no noted distress. Pt stable, neuro intact,  was able to transfer to sit up in chair. Daughter at bedside. Denied pain or discomfort. Safety measures in place. Call bell within reach. Will continue to monitor.
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
## 2015-01-28 NOTE — Progress Notes (Signed)
PT Cancellation Note  Patient Details Name: Gabriel Riley MRN: [id number] DOB: Nov 06, 1940   Cancelled Treatment:    Gabriel Eval/Treat Not Completed: Patient not medically ready (active bedrest orders at this time, will follow)   Duncan Dull 01/28/2015, 8:05 AM Alben Deeds, PT DPT  [phone]

## 2015-01-29 DIAGNOSIS — E785 Hyperlipidemia, unspecified: Secondary | ICD-10-CM | POA: Diagnosis present

## 2015-01-29 DIAGNOSIS — I10 Essential (primary) hypertension: Secondary | ICD-10-CM

## 2015-01-29 LAB — GLUCOSE, CAPILLARY
GLUCOSE-CAPILLARY: 108 mg/dL — AB (ref 65–99)
Glucose-Capillary: 160 mg/dL — ABNORMAL HIGH (ref 65–99)

## 2015-01-29 MED ORDER — GLIMEPIRIDE 4 MG PO TABS: 4.0000 mg | ORAL_TABLET | Freq: Every day | ORAL | Status: AC

## 2015-01-29 MED ORDER — ASPIRIN 81 MG PO CHEW: 81.0000 mg | CHEWABLE_TABLET | Freq: Every day | ORAL | Status: AC

## 2015-01-29 MED ORDER — GABAPENTIN 100 MG PO CAPS: 100.0000 mg | ORAL_CAPSULE | Freq: Two times a day (BID) | ORAL | Status: AC

## 2015-01-29 MED ORDER — SENNOSIDES-DOCUSATE SODIUM 8.6-50 MG PO TABS: 1.0000 | ORAL_TABLET | Freq: Every day | ORAL | Status: AC | PRN

## 2015-01-29 MED ORDER — POLYETHYLENE GLYCOL 3350 17 G PO PACK: 17.0000 g | PACK | Freq: Every day | ORAL | Status: AC | PRN

## 2015-01-29 NOTE — Progress Notes (Signed)
Discharge orders received. Pt and daughter educated on discharge instructions and verbalized understanding. Pt given discharge packet and packet for ALF. Report called to ALF. IV removed. Pt taken downstairs by staff via wheelchair.

## 2015-01-29 NOTE — Evaluation (Signed)
SLP Cancellation Note  Patient Details Name: Gabriel Riley MRN: [id number] DOB: [birth year]   Cancelled treatment:       Reason Eval/Treat Not Completed: Patient declined, no reason specified (pt states "I don't want no speech therapy, I don't understand why I am seeing all of these therapies; I want to get out of here." )  Note pt able to follow multiple step commands per MD note.   Luanna Salk, [facility] SLP [phone]

## 2015-01-29 NOTE — Care Management Note (Signed)
Case Management Note  Patient Details  Name: Gabriel Riley MRN: [id number] Date of Birth: Feb 19, 1941  Subjective/Objective:                    Action/Plan: Met with patient and daughter to discuss discharge planning. Patient and daughter are declining Outpatient OT and would only like Outpatient PT, as the preferred therapy location does not offer both services. CM offered to to send orders to [facility] where both services are provided. Patient and daughter declined.  Outpatient PT order was faxed to [facility] in [place] [phone], [phone]. [place] RN updated.  Expected Discharge Date:  02/01/15               Expected Discharge Plan:  Assisted Living / Rest Home (Patient is from [facility])  In-House Referral:  Clinical Social Work  Discharge planning Services  CM Consult  Post Acute Care Choice:    Choice offered to:     DME Arranged:    DME Agency:     HH Arranged:    [place] Agency:     Status of Service:  In process, will continue to follow  Medicare Important Message Given:    Date Medicare IM Given:    Medicare IM give by:    Date Additional Medicare IM Given:    Additional Medicare Important Message give by:     If discussed at [place] of Stay Meetings, dates discussed:    Additional Comments:  Rolm Baptise, RN 01/29/2015, 4:03 PM

## 2015-01-29 NOTE — Progress Notes (Addendum)
Inpatient Diabetes Program Recommendations  AACE/ADA: New Consensus Statement on Inpatient Glycemic Control (2013)  Target Ranges:  Prepandial:   less than 140 mg/dL      Peak postprandial:   less than 180 mg/dL (1-2 hours)      Critically ill patients:  140 - 180 mg/dL   Review of Glycemic control:  Results for Gabriel Riley, Gabriel Riley (MRN [id number]) as of 01/29/2015 13:55  Ref. Range 01/28/2015 12:02 01/28/2015 17:09 01/28/2015 22:07 01/29/2015 06:32 01/29/2015 11:48  Glucose-Capillary Latest Ref Range: 65-99 mg/dL 228 (H) 232 (H) 162 (H) 160 (H) 108 (H)  Results for Gabriel Riley, Gabriel Riley (MRN [id number]) as of 01/29/2015 13:55  Ref. Range 01/27/2015 09:20  Hemoglobin A1C Latest Ref Range: 4.8-5.6 % 10.2 (H)   Diabetes history: Type 2 diabetes Outpatient Diabetes medications: Lantus 25 units q HS, Amaryl 5 mg daily, Januvia 100 mg daily Current orders for Inpatient glycemic control:  Novolog moderate tid with meals, Tradgenta 5 mg daily, Amaryl 4 mg daily  Consider resuming a portion of patient's home dose of Lantus 15 units daily (0.2 units/kg). A1C indicates sub-optimal control of blood glucose prior to admit.    Thanks, Adah Perl, RN, BC-ADM Inpatient Diabetes Coordinator Pager [phone] (8a-5p)
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
## 2015-01-29 NOTE — Progress Notes (Signed)
CM met with patient and family to verify where patient was living prior to admission.  Patient resides at [facility]. He is currently undergoing outpatient therapy for his shoulder at [facility].  Per patient's family, his therapy orders are being written/managed by patient's orthopedic office.  CSW is aware that patient is from ALF. CM will continue to follow for any discharge needs.
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
## 2015-01-29 NOTE — Clinical Social Work Note (Signed)
Clinical Social Work Assessment  Patient Details  Name: Gabriel Riley MRN: [id number] Date of Birth: October 06, 1940  Date of referral:  01/29/15               Reason for consult:   (return to ALF )                Permission sought to share information with:  Family Supports Permission granted to share information::     Name::     Gabriel Riley,Gabriel Riley  Relationship::  daughter   Housing/Transportation Living arrangements for the past 2 months:  [place] of Information:  Patient, Adult Children Patient Interpreter Needed:  None Criminal Activity/Legal Involvement Pertinent to Current Situation/Hospitalization:  No - Comment as needed Significant Relationships:  Adult Children Lives with:  Facility Resident Do you feel safe going back to the place where you live?  No Need for family participation in patient care:  Yes (Comment)  Care giving concerns:  N/A   Social Worker assessment / plan:  Juliann Pulse was upset. Juliann Pulse reported that she did not want to speak with anymore and she was ready to go. Juliann Pulse requested to leave AMA with the pt. Nurse contacted MD. MD discharged the pt. CSW contacted Terri at [employer] [facility] [place] to inform her of the pt's return. Juliann Pulse reported that she will drive the pt.   Employment status:  Retired Forensic scientist:  Medicare PT Recommendations:  No Follow Up Information / Referral to community resources:   (N/A)  Patient/Family's Response to care:  Juliann Pulse did not response due to being upset.   Patient/Family's Understanding of and Emotional Response to Diagnosis, Current Treatment, and Prognosis:  N/A   Emotional Assessment Appearance:  Appears older than stated age Attitude/Demeanor/Rapport:   (Calm ) Affect (typically observed):  Accepting Orientation:  Oriented to Place, Oriented to  Time, Oriented to Situation, Oriented to Self Alcohol / Substance use:  Not Applicable Psych involvement (Current and /or in the community):   No (Comment)  Discharge Needs  Concerns to be addressed:  Denies Needs/Concerns at this time Readmission within the last 30 days:  No Current discharge risk:  None Barriers to Discharge:  No Barriers Identified   Tanis Hensarling, LCSW 01/29/2015, 3:53 PM

## 2015-02-01 DIAGNOSIS — M25511 Pain in right shoulder: Secondary | ICD-10-CM | POA: Diagnosis not present

## 2015-02-04 DIAGNOSIS — S066X0S Traumatic subarachnoid hemorrhage without loss of consciousness, sequela: Secondary | ICD-10-CM | POA: Diagnosis not present

## 2015-02-04 DIAGNOSIS — I5043 Acute on chronic combined systolic (congestive) and diastolic (congestive) heart failure: Secondary | ICD-10-CM | POA: Diagnosis not present

## 2015-02-04 DIAGNOSIS — I110 Hypertensive heart disease with heart failure: Secondary | ICD-10-CM | POA: Diagnosis not present

## 2015-02-04 DIAGNOSIS — S0101XS Laceration without foreign body of scalp, sequela: Secondary | ICD-10-CM | POA: Diagnosis not present

## 2015-02-05 DIAGNOSIS — M25511 Pain in right shoulder: Secondary | ICD-10-CM | POA: Diagnosis not present

## 2015-02-08 DIAGNOSIS — M25511 Pain in right shoulder: Secondary | ICD-10-CM | POA: Diagnosis not present

## 2015-02-12 DIAGNOSIS — M25511 Pain in right shoulder: Secondary | ICD-10-CM | POA: Diagnosis not present

## 2015-02-14 DIAGNOSIS — E119 Type 2 diabetes mellitus without complications: Secondary | ICD-10-CM | POA: Diagnosis not present

## 2015-02-14 DIAGNOSIS — E1165 Type 2 diabetes mellitus with hyperglycemia: Secondary | ICD-10-CM | POA: Diagnosis not present

## 2015-02-15 DIAGNOSIS — M25511 Pain in right shoulder: Secondary | ICD-10-CM | POA: Diagnosis not present

## 2015-02-18 DIAGNOSIS — H52223 Regular astigmatism, bilateral: Secondary | ICD-10-CM | POA: Diagnosis not present

## 2015-02-18 DIAGNOSIS — E119 Type 2 diabetes mellitus without complications: Secondary | ICD-10-CM | POA: Diagnosis not present

## 2015-02-18 DIAGNOSIS — H524 Presbyopia: Secondary | ICD-10-CM | POA: Diagnosis not present

## 2015-02-18 DIAGNOSIS — H5203 Hypermetropia, bilateral: Secondary | ICD-10-CM | POA: Diagnosis not present

## 2015-02-18 DIAGNOSIS — H25813 Combined forms of age-related cataract, bilateral: Secondary | ICD-10-CM | POA: Diagnosis not present

## 2015-02-18 DIAGNOSIS — I10 Essential (primary) hypertension: Secondary | ICD-10-CM | POA: Diagnosis not present

## 2015-02-20 DIAGNOSIS — M25511 Pain in right shoulder: Secondary | ICD-10-CM | POA: Diagnosis not present

## 2015-02-21 DIAGNOSIS — N189 Chronic kidney disease, unspecified: Secondary | ICD-10-CM | POA: Diagnosis not present

## 2015-02-21 DIAGNOSIS — Z1389 Encounter for screening for other disorder: Secondary | ICD-10-CM | POA: Diagnosis not present

## 2015-02-21 DIAGNOSIS — E1165 Type 2 diabetes mellitus with hyperglycemia: Secondary | ICD-10-CM | POA: Diagnosis not present

## 2015-02-22 DIAGNOSIS — M25511 Pain in right shoulder: Secondary | ICD-10-CM | POA: Diagnosis not present

## 2015-02-27 DIAGNOSIS — M25511 Pain in right shoulder: Secondary | ICD-10-CM | POA: Diagnosis not present

## 2015-03-01 DIAGNOSIS — M25511 Pain in right shoulder: Secondary | ICD-10-CM | POA: Diagnosis not present

## 2015-03-05 DIAGNOSIS — M25511 Pain in right shoulder: Secondary | ICD-10-CM | POA: Diagnosis not present

## 2015-03-07 DIAGNOSIS — M25511 Pain in right shoulder: Secondary | ICD-10-CM | POA: Diagnosis not present

## 2015-03-07 DIAGNOSIS — M7501 Adhesive capsulitis of right shoulder: Secondary | ICD-10-CM | POA: Diagnosis not present

## 2015-03-08 DIAGNOSIS — M25511 Pain in right shoulder: Secondary | ICD-10-CM | POA: Diagnosis not present

## 2015-03-12 DIAGNOSIS — M25511 Pain in right shoulder: Secondary | ICD-10-CM | POA: Diagnosis not present

## 2015-03-14 DIAGNOSIS — M25511 Pain in right shoulder: Secondary | ICD-10-CM | POA: Diagnosis not present

## 2015-03-18 DIAGNOSIS — Z23 Encounter for immunization: Secondary | ICD-10-CM | POA: Diagnosis not present

## 2015-03-18 DIAGNOSIS — C4372 Malignant melanoma of left lower limb, including hip: Secondary | ICD-10-CM | POA: Diagnosis not present

## 2015-03-18 DIAGNOSIS — D485 Neoplasm of uncertain behavior of skin: Secondary | ICD-10-CM | POA: Diagnosis not present

## 2015-03-18 DIAGNOSIS — M25511 Pain in right shoulder: Secondary | ICD-10-CM | POA: Diagnosis not present

## 2015-03-18 DIAGNOSIS — L821 Other seborrheic keratosis: Secondary | ICD-10-CM | POA: Diagnosis not present

## 2015-03-18 DIAGNOSIS — L578 Other skin changes due to chronic exposure to nonionizing radiation: Secondary | ICD-10-CM | POA: Diagnosis not present

## 2015-03-21 DIAGNOSIS — M25511 Pain in right shoulder: Secondary | ICD-10-CM | POA: Diagnosis not present

## 2015-03-26 DIAGNOSIS — C4372 Malignant melanoma of left lower limb, including hip: Secondary | ICD-10-CM | POA: Diagnosis not present

## 2015-04-02 DIAGNOSIS — M25511 Pain in right shoulder: Secondary | ICD-10-CM | POA: Diagnosis not present

## 2015-04-04 DIAGNOSIS — M25511 Pain in right shoulder: Secondary | ICD-10-CM | POA: Diagnosis not present

## 2015-04-09 DIAGNOSIS — M25511 Pain in right shoulder: Secondary | ICD-10-CM | POA: Diagnosis not present

## 2015-04-09 DIAGNOSIS — L02416 Cutaneous abscess of left lower limb: Secondary | ICD-10-CM | POA: Diagnosis not present

## 2015-04-11 DIAGNOSIS — M25511 Pain in right shoulder: Secondary | ICD-10-CM | POA: Diagnosis not present

## 2015-04-18 DIAGNOSIS — E119 Type 2 diabetes mellitus without complications: Secondary | ICD-10-CM | POA: Diagnosis not present

## 2015-04-18 DIAGNOSIS — E1165 Type 2 diabetes mellitus with hyperglycemia: Secondary | ICD-10-CM | POA: Diagnosis not present

## 2015-04-22 DIAGNOSIS — Z79899 Other long term (current) drug therapy: Secondary | ICD-10-CM | POA: Diagnosis not present

## 2015-04-23 DIAGNOSIS — B351 Tinea unguium: Secondary | ICD-10-CM | POA: Diagnosis not present

## 2015-04-23 DIAGNOSIS — E1165 Type 2 diabetes mellitus with hyperglycemia: Secondary | ICD-10-CM | POA: Diagnosis not present

## 2015-04-23 DIAGNOSIS — N179 Acute kidney failure, unspecified: Secondary | ICD-10-CM | POA: Diagnosis not present

## 2015-04-23 DIAGNOSIS — L609 Nail disorder, unspecified: Secondary | ICD-10-CM | POA: Diagnosis not present

## 2015-04-23 DIAGNOSIS — N189 Chronic kidney disease, unspecified: Secondary | ICD-10-CM | POA: Diagnosis not present

## 2015-04-23 DIAGNOSIS — E119 Type 2 diabetes mellitus without complications: Secondary | ICD-10-CM | POA: Diagnosis not present

## 2015-05-09 ENCOUNTER — Other Ambulatory Visit: Payer: Self-pay

## 2015-05-09 NOTE — Patient Outreach (Signed)
[name]) Care Management  05/09/2015  Gabriel Riley 1940/11/30 [id number]   Telephone Screen  Referral Date: 05/08/15 Referral Source:NextGen Tier 4 List  Referral Reason: CHF, 2 admits, 2 SNFs  Outreach attempt #1 to patient. Patient not reached and unable to leave voicemail message.  Plan: RN CM will attempt outreach call to patient within a week.  Enzo Montgomery, RN,BSN,CCM [place] Management Telephonic Care Management Coordinator Direct Phone: [phone] Toll Free: [phone] Fax: [phone]

## 2015-05-10 ENCOUNTER — Other Ambulatory Visit: Payer: Self-pay

## 2015-05-10 ENCOUNTER — Ambulatory Visit: Payer: Self-pay

## 2015-05-10 NOTE — Patient Outreach (Signed)
[facility]) Care Management  05/10/2015  Gabriel Riley 1941/05/05 [id number]   Telephone Screen  Referral Date: 05/08/15 Referral Source:NextGen Tier 4 List  Referral Reason: CHF, 2 admits, 2 SNFs   Outreach attempt # 2 to patient. Patient not reached. No answer and RN CM unable to leave voicemail message.  Plan: RN CM will attempt outreach call to patient within a week.  Enzo Montgomery, RN,BSN,CCM [place] Management Telephonic Care Management Coordinator Direct Phone: [phone] Toll Free: [phone] Fax: [phone]

## 2015-05-15 ENCOUNTER — Other Ambulatory Visit: Payer: Self-pay

## 2015-05-15 ENCOUNTER — Ambulatory Visit: Payer: Self-pay

## 2015-05-15 NOTE — Patient Outreach (Signed)
[place]) Care Management  05/15/2015  [place] Carbin Husby 05-06-1941 [id number]   Telephone Screen  Referral Date: 05/08/15 Referral Source:NextGen Tier 4 List  Referral Reason: CHF, 2 admits, 2 SNFs   Outreach attempt #3 to patient. No answer at home phone number. RN CM attempted mobile number and no answer.  Plan: RN CM will send unable to reach letter to patient and will close case if no response from patient within 10 days.  Enzo Montgomery, RN,BSN,CCM [place] Management Telephonic Care Management Coordinator Direct Phone: [phone] Toll Free: [phone] Fax: [phone]

## 2015-05-29 ENCOUNTER — Other Ambulatory Visit: Payer: Self-pay

## 2015-05-29 NOTE — Patient Outreach (Signed)
[facility]) Care Management  05/29/2015  Angel Diagne Balint 14-Jul-1940 [id number]   Telephone Screen  Referral Date: 05/08/15 Referral Source:[employer] Tier 4 List  Referral Reason: CHF, 2 admits, 2 SNFs   RN CM has made multiple attempts to reach patient via phone to offer [facility] services. No response from patient from unable to reach letter. Case being closed out per policy at this time.   Plan: RN CM will notify [facility] administrative assistant of case closure. RN CM will notify MD via case closure letter.  Enzo Montgomery, RN,BSN,CCM [place] [facility] Telephonic Care Management Coordinator Direct Phone: [phone] Toll Free: [phone] Fax: [phone]

## 2015-07-04 DIAGNOSIS — E119 Type 2 diabetes mellitus without complications: Secondary | ICD-10-CM | POA: Diagnosis not present

## 2015-07-04 DIAGNOSIS — E1165 Type 2 diabetes mellitus with hyperglycemia: Secondary | ICD-10-CM | POA: Diagnosis not present

## 2015-07-16 DIAGNOSIS — I10 Essential (primary) hypertension: Secondary | ICD-10-CM | POA: Diagnosis not present

## 2015-07-16 DIAGNOSIS — Z1389 Encounter for screening for other disorder: Secondary | ICD-10-CM | POA: Diagnosis not present

## 2015-07-16 DIAGNOSIS — N189 Chronic kidney disease, unspecified: Secondary | ICD-10-CM | POA: Diagnosis not present

## 2015-07-16 DIAGNOSIS — E1165 Type 2 diabetes mellitus with hyperglycemia: Secondary | ICD-10-CM | POA: Diagnosis not present

## 2015-08-01 DIAGNOSIS — L821 Other seborrheic keratosis: Secondary | ICD-10-CM | POA: Diagnosis not present

## 2015-08-01 DIAGNOSIS — L219 Seborrheic dermatitis, unspecified: Secondary | ICD-10-CM | POA: Diagnosis not present

## 2015-08-01 DIAGNOSIS — C4372 Malignant melanoma of left lower limb, including hip: Secondary | ICD-10-CM | POA: Diagnosis not present

## 2015-09-03 DIAGNOSIS — E119 Type 2 diabetes mellitus without complications: Secondary | ICD-10-CM | POA: Diagnosis not present

## 2015-09-03 DIAGNOSIS — E1165 Type 2 diabetes mellitus with hyperglycemia: Secondary | ICD-10-CM | POA: Diagnosis not present

## 2015-09-03 DIAGNOSIS — E785 Hyperlipidemia, unspecified: Secondary | ICD-10-CM | POA: Diagnosis not present

## 2015-09-11 DIAGNOSIS — B351 Tinea unguium: Secondary | ICD-10-CM | POA: Diagnosis not present

## 2015-09-11 DIAGNOSIS — E1165 Type 2 diabetes mellitus with hyperglycemia: Secondary | ICD-10-CM | POA: Diagnosis not present

## 2015-09-11 DIAGNOSIS — N189 Chronic kidney disease, unspecified: Secondary | ICD-10-CM | POA: Diagnosis not present

## 2015-09-12 DIAGNOSIS — R0602 Shortness of breath: Secondary | ICD-10-CM | POA: Diagnosis not present

## 2015-09-12 DIAGNOSIS — E119 Type 2 diabetes mellitus without complications: Secondary | ICD-10-CM | POA: Diagnosis not present

## 2015-09-12 DIAGNOSIS — Z794 Long term (current) use of insulin: Secondary | ICD-10-CM | POA: Diagnosis not present

## 2015-09-12 DIAGNOSIS — I429 Cardiomyopathy, unspecified: Secondary | ICD-10-CM | POA: Diagnosis not present

## 2015-09-12 DIAGNOSIS — E785 Hyperlipidemia, unspecified: Secondary | ICD-10-CM | POA: Diagnosis not present

## 2015-09-18 DIAGNOSIS — I429 Cardiomyopathy, unspecified: Secondary | ICD-10-CM | POA: Diagnosis not present

## 2015-09-18 DIAGNOSIS — R0602 Shortness of breath: Secondary | ICD-10-CM | POA: Diagnosis not present

## 2015-09-18 DIAGNOSIS — E785 Hyperlipidemia, unspecified: Secondary | ICD-10-CM | POA: Diagnosis not present

## 2015-10-22 DIAGNOSIS — E119 Type 2 diabetes mellitus without complications: Secondary | ICD-10-CM | POA: Diagnosis not present

## 2015-10-22 DIAGNOSIS — I429 Cardiomyopathy, unspecified: Secondary | ICD-10-CM | POA: Diagnosis not present

## 2015-10-22 DIAGNOSIS — E785 Hyperlipidemia, unspecified: Secondary | ICD-10-CM | POA: Diagnosis not present

## 2015-10-22 DIAGNOSIS — Z794 Long term (current) use of insulin: Secondary | ICD-10-CM | POA: Diagnosis not present

## 2015-10-28 DIAGNOSIS — N189 Chronic kidney disease, unspecified: Secondary | ICD-10-CM | POA: Diagnosis not present

## 2015-10-28 DIAGNOSIS — E1159 Type 2 diabetes mellitus with other circulatory complications: Secondary | ICD-10-CM | POA: Diagnosis not present

## 2015-10-28 DIAGNOSIS — Z0000 Encounter for general adult medical examination without abnormal findings: Secondary | ICD-10-CM | POA: Diagnosis not present

## 2015-10-28 DIAGNOSIS — E785 Hyperlipidemia, unspecified: Secondary | ICD-10-CM | POA: Diagnosis not present

## 2015-11-27 IMAGING — CR DG CHEST 2V
2 series · 2 of 2 positions shown · non-contrast
Comparison: 09/15/2014 and 08/21/2014 radiographs.  CT 06/01/2014.

CLINICAL DATA: Congestive heart failure.

EXAM:
CHEST  2 VIEW

[chest lat]
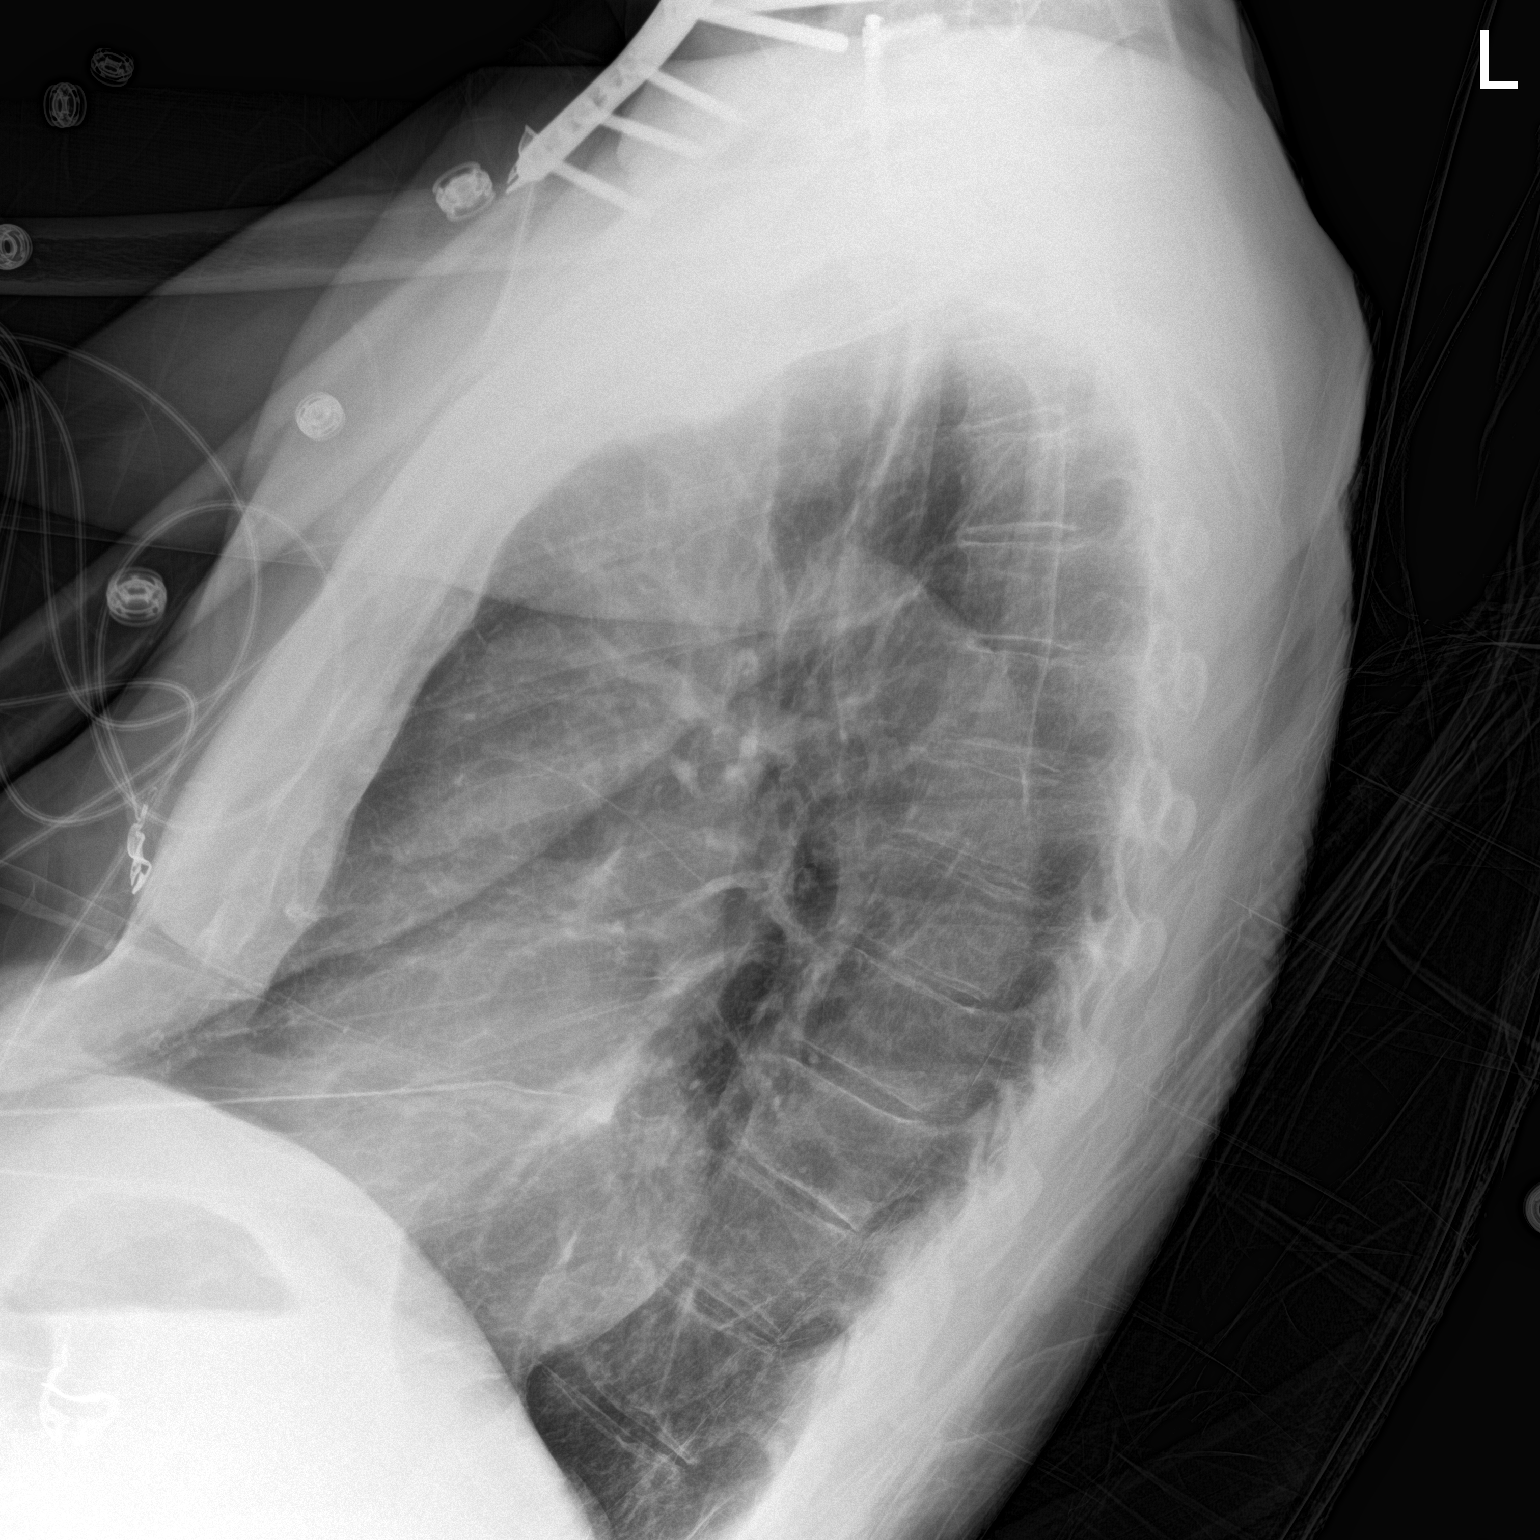

[chest ap]
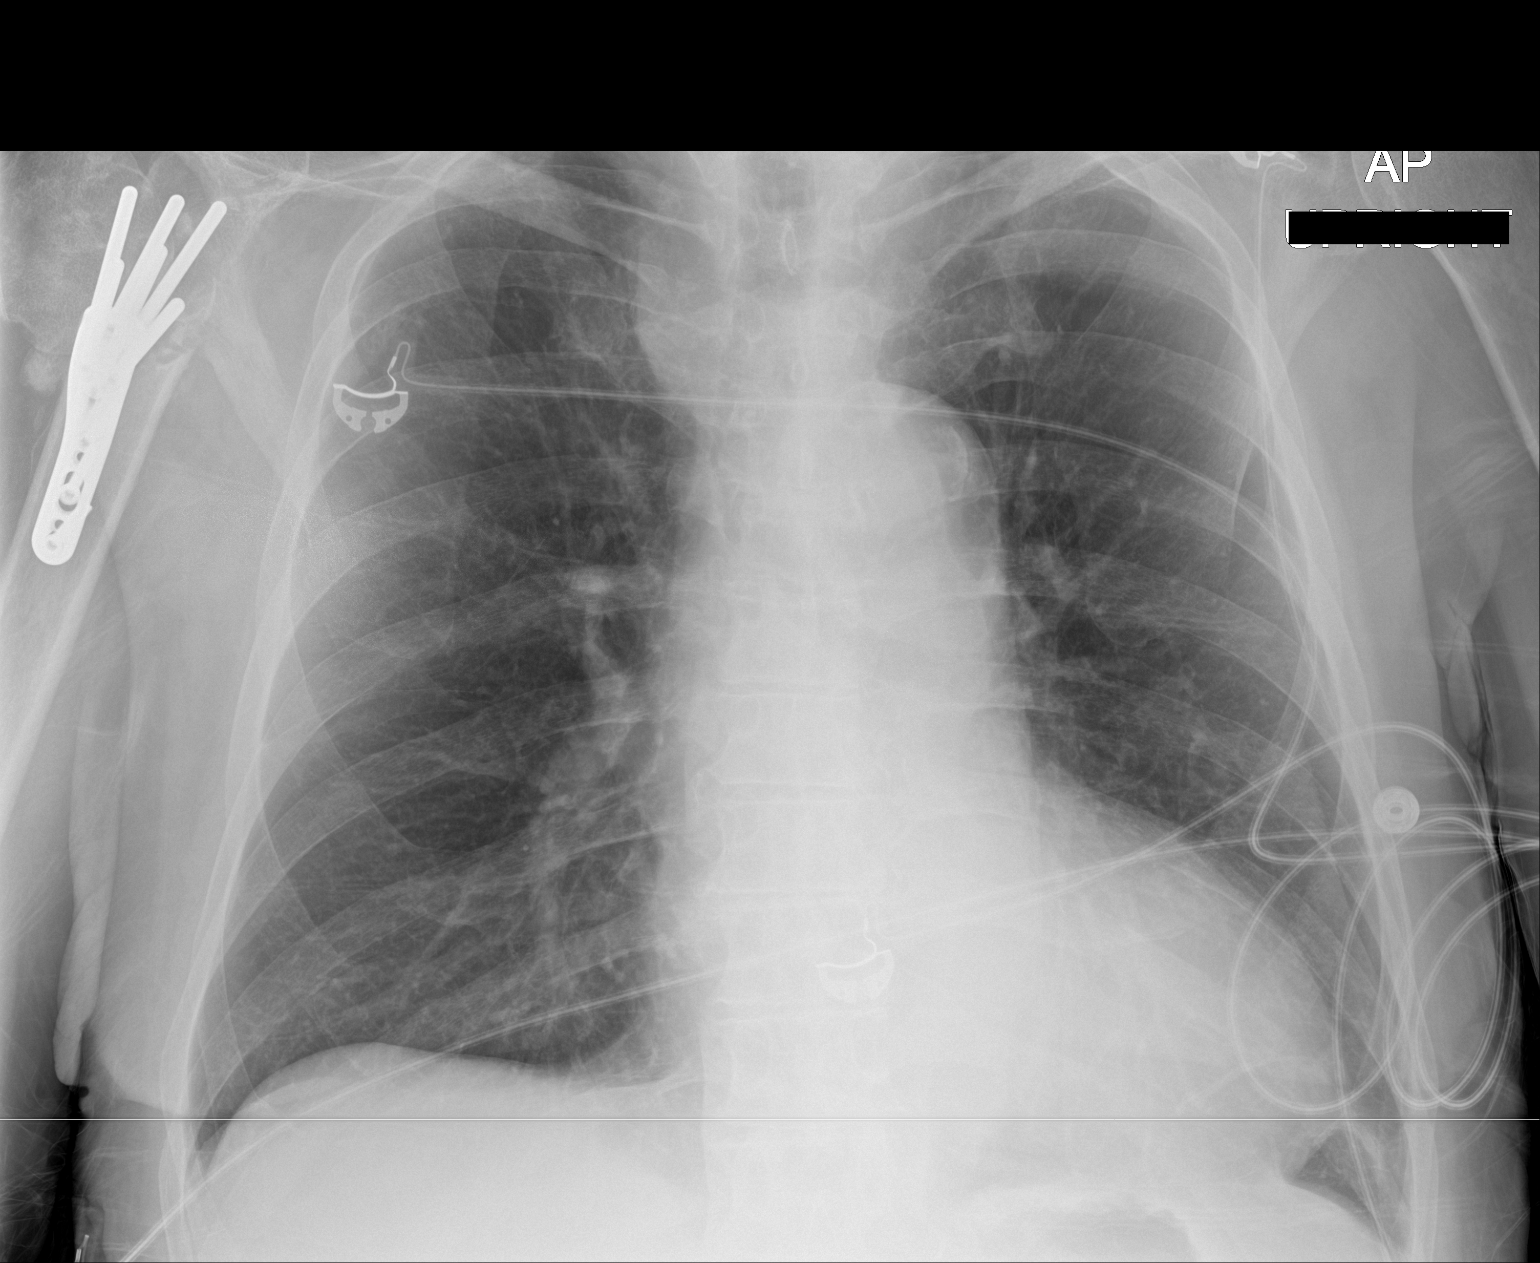

[2 of 2 positions shown; findings below may reference images not displayed]

FINDINGS: The heart size and mediastinal contours are stable. There is mild
cardiomegaly with aortic atherosclerosis and chronic right
paratracheal density, corresponding with a stable mediastinal fluid
collection on prior CT. The lungs are clear. There is no pleural
effusion or pneumothorax. Pectus deformity of the sternum, previous
right shoulder fixation and the lower cervical fusion noted. The
proximal right humeral fracture does not appear completely healed,
and there is concern of displacement of the hardware compared with
prior shoulder radiographs.
IMPRESSION: 1. No active cardiopulmonary process.  Stable cardiomegaly.
2. Concern of hardware failure and nonunion at proximal right
humeral fracture.

## 2015-11-27 IMAGING — CT CT HEAD W/O CM
1 series · 16 of 30 positions shown, 20 images · non-contrast
Comparison: 01/27/2015 at [DATE] a.m.

CLINICAL DATA: Followup for subarachnoid hemorrhage.

EXAM:
CT HEAD WITHOUT CONTRAST
TECHNIQUE: Contiguous axial images were obtained from the base of the skull
through the vertex without intravenous contrast.

[Series 2: head 5.0 h30s · axial · 0.45mm/px · z∈[-123,+27]mm · 16 of 34 slices shown, 20 images]
[im 2/34  brain]
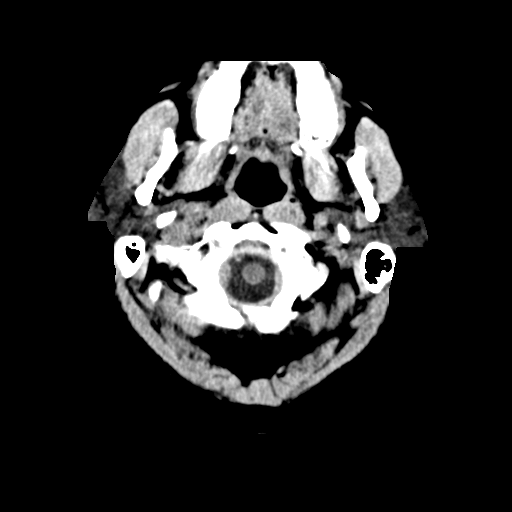
[im 2/34  bone]
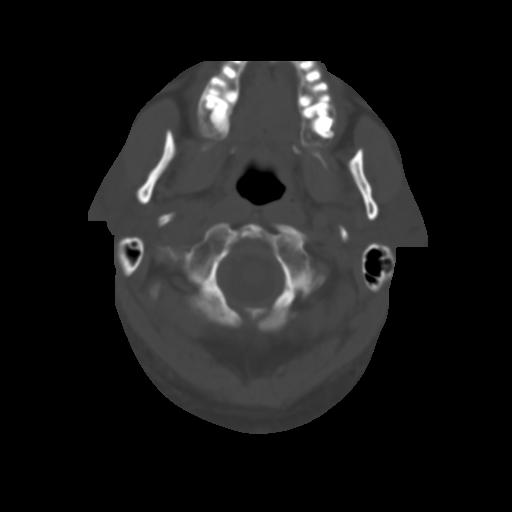
[im 4/34  brain]
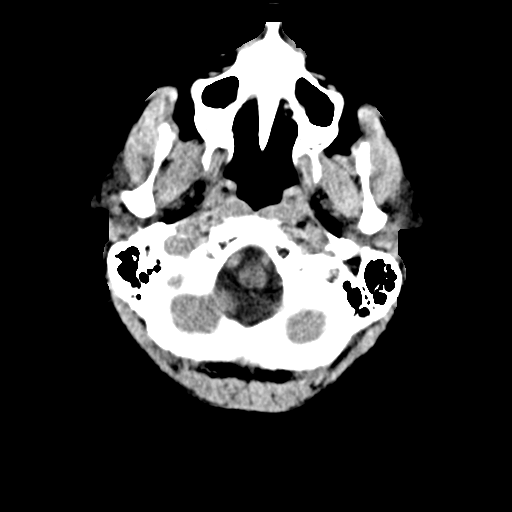
[im 6/34  brain]
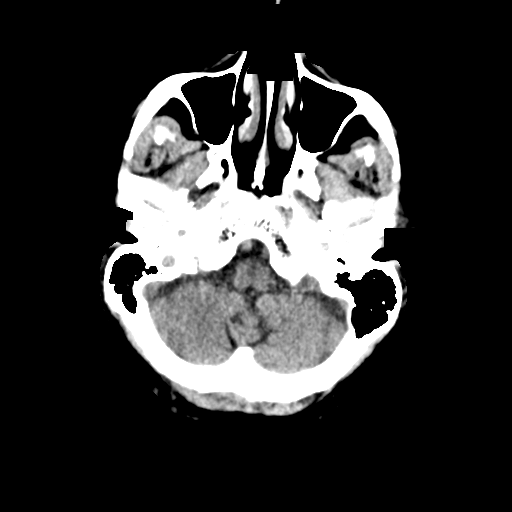
[im 8/34  brain]
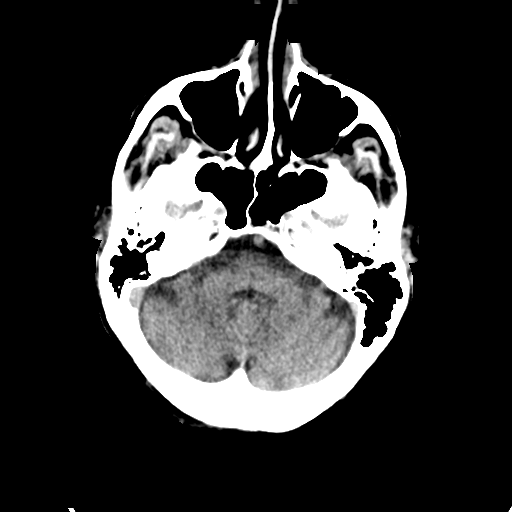
[im 10/34  brain]
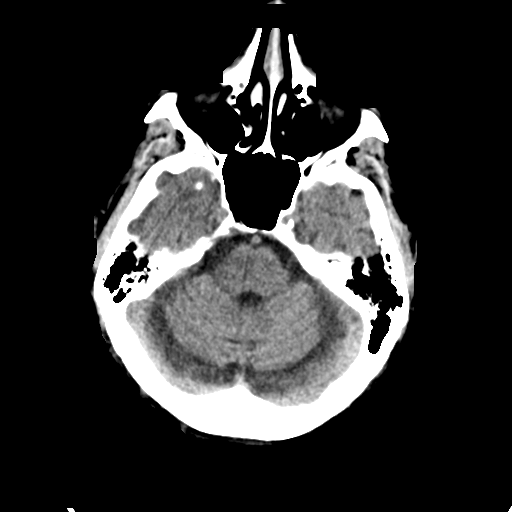
[im 10/34  bone]
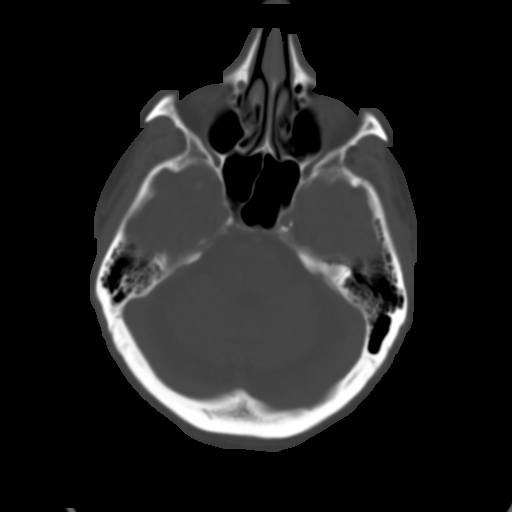
[im 12/34  brain]
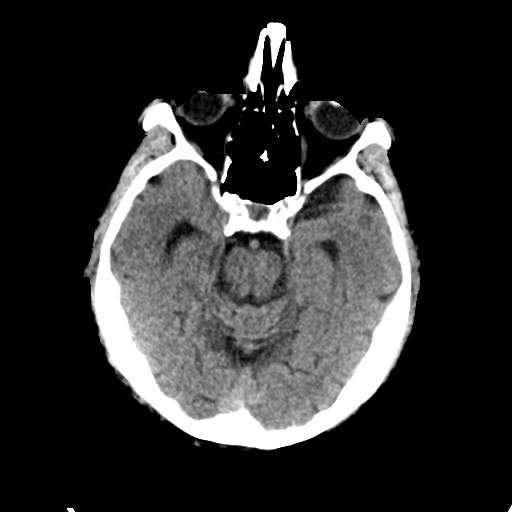
[im 14/34  brain]
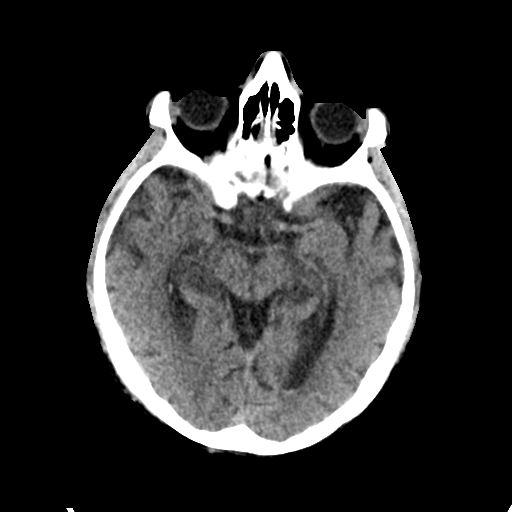
[im 16/34  brain]
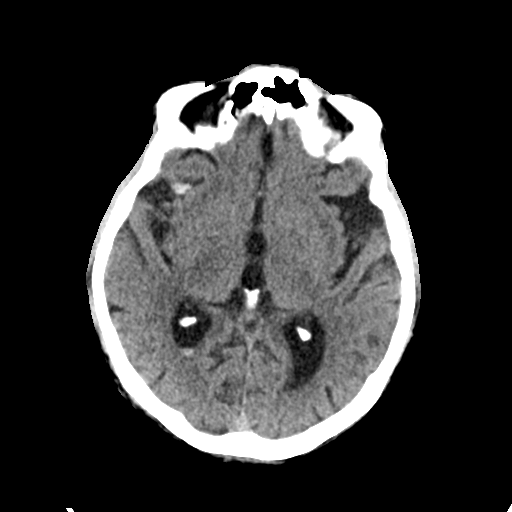
[im 18/34  brain]
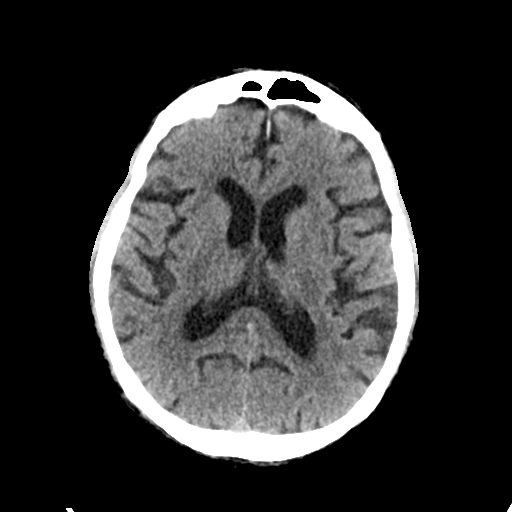
[im 18/34  bone]
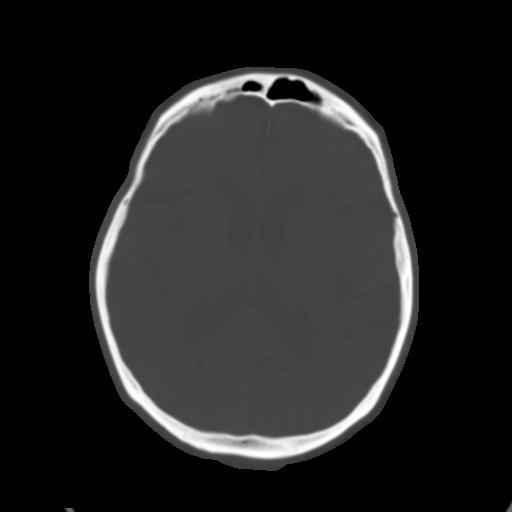
[im 20/34  brain]
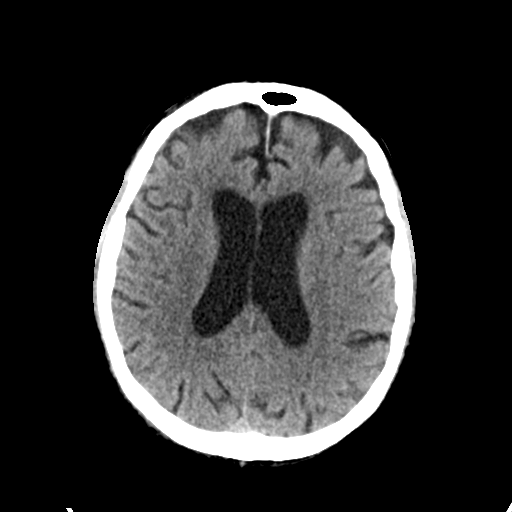
[im 22/34  brain]
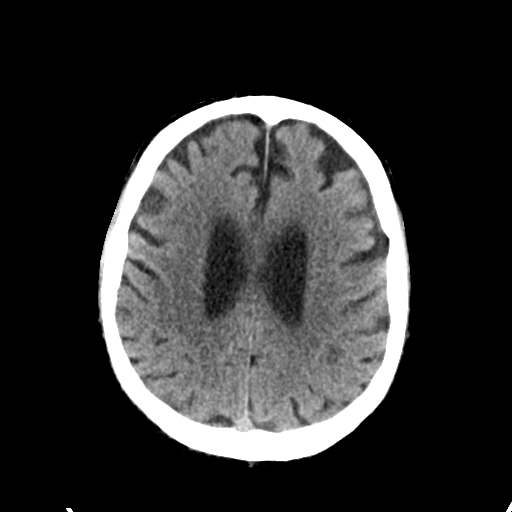
[im 24/34  brain]
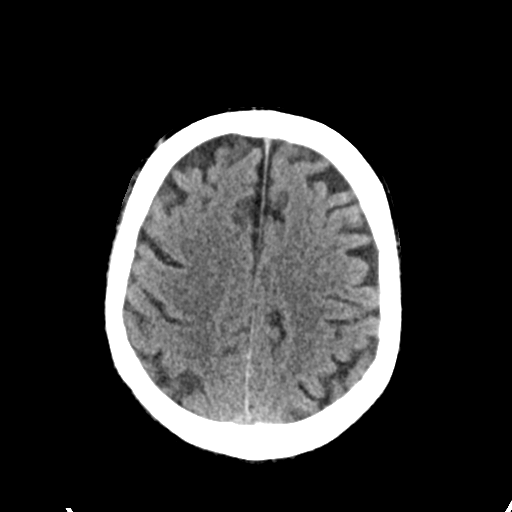
[im 26/34  brain]
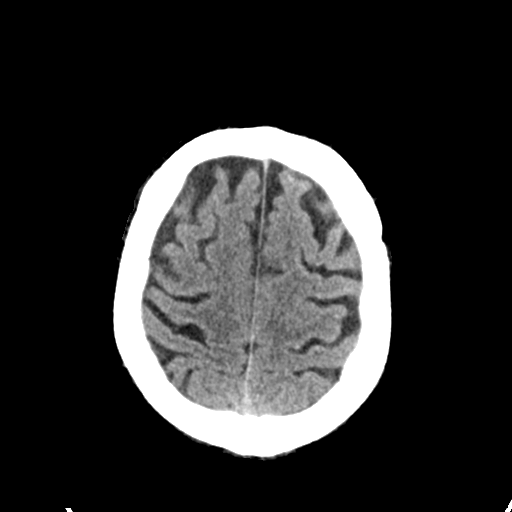
[im 26/34  bone]
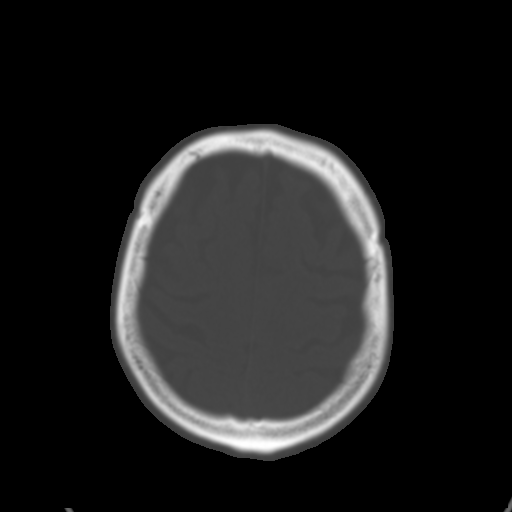
[im 28/34  brain]
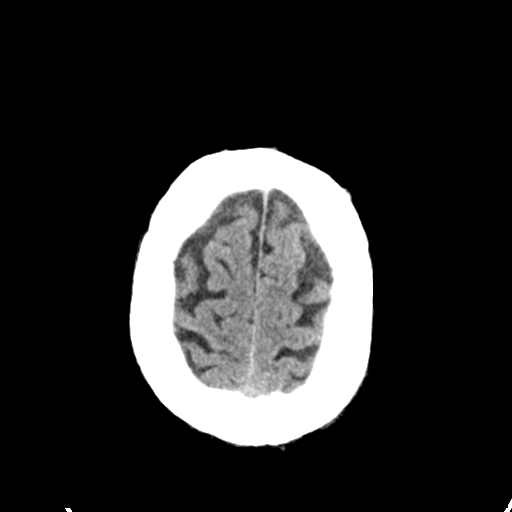
[im 30/34  brain]
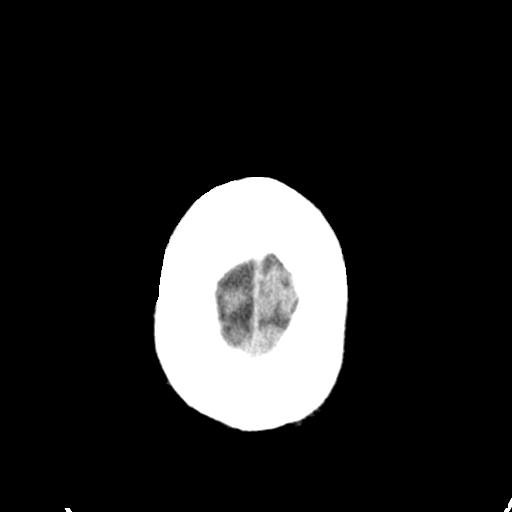
[im 32/34  brain]
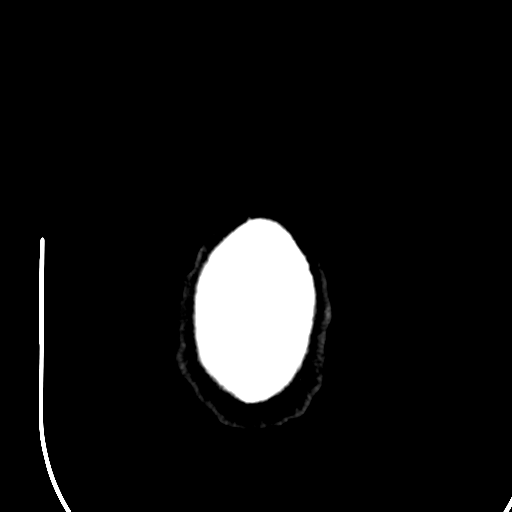

[16 of 30 positions shown; findings below may reference images not displayed]

FINDINGS: Small focus of subarachnoid hemorrhage along the anterior right
sylvian fissure is unchanged.

No new intracranial hemorrhage.

Ventricles are normal configuration. There is ventricular and sulcal
enlargement reflecting moderate atrophy. No hydrocephalus.

There are no parenchymal masses or mass effect. There is no evidence
of a cortical infarct. There are no extra-axial masses.

There is minor maxillary sinus mucosal thickening. Remaining
visualized sinuses clear as are the mastoid air cells.
IMPRESSION: 1. No change from the CT obtained earlier this same date.
2. Small focus of subarachnoid hemorrhage is again noted along the
anterior right sylvian fissure.
3. Moderate atrophy.  No hydrocephalus.  No evidence of an infarct.

## 2016-01-01 DIAGNOSIS — E119 Type 2 diabetes mellitus without complications: Secondary | ICD-10-CM | POA: Diagnosis not present

## 2016-01-06 DIAGNOSIS — E1165 Type 2 diabetes mellitus with hyperglycemia: Secondary | ICD-10-CM | POA: Diagnosis not present

## 2016-01-06 DIAGNOSIS — N189 Chronic kidney disease, unspecified: Secondary | ICD-10-CM | POA: Diagnosis not present

## 2016-01-06 DIAGNOSIS — Z9119 Patient's noncompliance with other medical treatment and regimen: Secondary | ICD-10-CM | POA: Diagnosis not present

## 2016-01-29 DIAGNOSIS — C44629 Squamous cell carcinoma of skin of left upper limb, including shoulder: Secondary | ICD-10-CM | POA: Diagnosis not present

## 2016-01-29 DIAGNOSIS — D225 Melanocytic nevi of trunk: Secondary | ICD-10-CM | POA: Diagnosis not present

## 2016-01-29 DIAGNOSIS — L821 Other seborrheic keratosis: Secondary | ICD-10-CM | POA: Diagnosis not present

## 2016-01-29 DIAGNOSIS — C4359 Malignant melanoma of other part of trunk: Secondary | ICD-10-CM | POA: Diagnosis not present

## 2016-01-29 DIAGNOSIS — C4372 Malignant melanoma of left lower limb, including hip: Secondary | ICD-10-CM | POA: Diagnosis not present

## 2016-02-05 DIAGNOSIS — D0462 Carcinoma in situ of skin of left upper limb, including shoulder: Secondary | ICD-10-CM | POA: Diagnosis not present

## 2016-02-20 DIAGNOSIS — M1990 Unspecified osteoarthritis, unspecified site: Secondary | ICD-10-CM | POA: Diagnosis not present

## 2016-02-20 DIAGNOSIS — I951 Orthostatic hypotension: Secondary | ICD-10-CM | POA: Diagnosis not present

## 2016-02-20 DIAGNOSIS — Z79899 Other long term (current) drug therapy: Secondary | ICD-10-CM | POA: Diagnosis not present

## 2016-02-20 DIAGNOSIS — Z9181 History of falling: Secondary | ICD-10-CM | POA: Diagnosis not present

## 2016-02-25 DIAGNOSIS — E785 Hyperlipidemia, unspecified: Secondary | ICD-10-CM | POA: Diagnosis not present

## 2016-02-25 DIAGNOSIS — R42 Dizziness and giddiness: Secondary | ICD-10-CM | POA: Diagnosis not present

## 2016-02-25 DIAGNOSIS — E119 Type 2 diabetes mellitus without complications: Secondary | ICD-10-CM | POA: Diagnosis not present

## 2016-02-25 DIAGNOSIS — I420 Dilated cardiomyopathy: Secondary | ICD-10-CM | POA: Diagnosis not present

## 2016-02-25 DIAGNOSIS — Z794 Long term (current) use of insulin: Secondary | ICD-10-CM | POA: Diagnosis not present

## 2016-03-02 DIAGNOSIS — R42 Dizziness and giddiness: Secondary | ICD-10-CM | POA: Diagnosis not present

## 2016-03-03 DIAGNOSIS — E1122 Type 2 diabetes mellitus with diabetic chronic kidney disease: Secondary | ICD-10-CM | POA: Diagnosis not present

## 2016-03-03 DIAGNOSIS — E1165 Type 2 diabetes mellitus with hyperglycemia: Secondary | ICD-10-CM | POA: Diagnosis not present

## 2016-03-03 DIAGNOSIS — N183 Chronic kidney disease, stage 3 (moderate): Secondary | ICD-10-CM | POA: Diagnosis not present

## 2016-03-03 DIAGNOSIS — Z794 Long term (current) use of insulin: Secondary | ICD-10-CM | POA: Diagnosis not present

## 2016-03-03 DIAGNOSIS — E1142 Type 2 diabetes mellitus with diabetic polyneuropathy: Secondary | ICD-10-CM | POA: Diagnosis not present

## 2016-03-17 DIAGNOSIS — Z794 Long term (current) use of insulin: Secondary | ICD-10-CM | POA: Diagnosis not present

## 2016-03-17 DIAGNOSIS — E1165 Type 2 diabetes mellitus with hyperglycemia: Secondary | ICD-10-CM | POA: Diagnosis not present

## 2016-03-17 DIAGNOSIS — B351 Tinea unguium: Secondary | ICD-10-CM | POA: Diagnosis not present

## 2016-03-17 DIAGNOSIS — E1142 Type 2 diabetes mellitus with diabetic polyneuropathy: Secondary | ICD-10-CM | POA: Diagnosis not present

## 2016-03-19 DIAGNOSIS — Z6824 Body mass index (BMI) 24.0-24.9, adult: Secondary | ICD-10-CM | POA: Diagnosis not present

## 2016-03-19 DIAGNOSIS — E119 Type 2 diabetes mellitus without complications: Secondary | ICD-10-CM | POA: Diagnosis not present

## 2016-03-19 DIAGNOSIS — I483 Typical atrial flutter: Secondary | ICD-10-CM | POA: Diagnosis not present

## 2016-03-19 DIAGNOSIS — E785 Hyperlipidemia, unspecified: Secondary | ICD-10-CM | POA: Diagnosis not present

## 2016-03-19 DIAGNOSIS — I420 Dilated cardiomyopathy: Secondary | ICD-10-CM | POA: Diagnosis not present

## 2016-03-19 DIAGNOSIS — R42 Dizziness and giddiness: Secondary | ICD-10-CM | POA: Diagnosis not present

## 2016-03-19 DIAGNOSIS — Z794 Long term (current) use of insulin: Secondary | ICD-10-CM | POA: Diagnosis not present

## 2016-04-06 DIAGNOSIS — Z23 Encounter for immunization: Secondary | ICD-10-CM | POA: Diagnosis not present

## 2016-05-05 DIAGNOSIS — E1142 Type 2 diabetes mellitus with diabetic polyneuropathy: Secondary | ICD-10-CM | POA: Diagnosis not present

## 2016-05-05 DIAGNOSIS — Z794 Long term (current) use of insulin: Secondary | ICD-10-CM | POA: Diagnosis not present

## 2016-05-05 DIAGNOSIS — E782 Mixed hyperlipidemia: Secondary | ICD-10-CM | POA: Diagnosis not present

## 2016-05-05 DIAGNOSIS — E1165 Type 2 diabetes mellitus with hyperglycemia: Secondary | ICD-10-CM | POA: Diagnosis not present

## 2016-05-07 DIAGNOSIS — I420 Dilated cardiomyopathy: Secondary | ICD-10-CM | POA: Diagnosis not present

## 2016-05-07 DIAGNOSIS — E119 Type 2 diabetes mellitus without complications: Secondary | ICD-10-CM | POA: Diagnosis not present

## 2016-05-07 DIAGNOSIS — Z6824 Body mass index (BMI) 24.0-24.9, adult: Secondary | ICD-10-CM | POA: Diagnosis not present

## 2016-05-07 DIAGNOSIS — I483 Typical atrial flutter: Secondary | ICD-10-CM | POA: Diagnosis not present

## 2016-05-07 DIAGNOSIS — E785 Hyperlipidemia, unspecified: Secondary | ICD-10-CM | POA: Diagnosis not present

## 2016-07-21 DIAGNOSIS — Z794 Long term (current) use of insulin: Secondary | ICD-10-CM | POA: Diagnosis not present

## 2016-07-21 DIAGNOSIS — E1142 Type 2 diabetes mellitus with diabetic polyneuropathy: Secondary | ICD-10-CM | POA: Diagnosis not present

## 2016-07-21 DIAGNOSIS — G5791 Unspecified mononeuropathy of right lower limb: Secondary | ICD-10-CM | POA: Diagnosis not present

## 2016-07-21 DIAGNOSIS — G5792 Unspecified mononeuropathy of left lower limb: Secondary | ICD-10-CM | POA: Diagnosis not present

## 2016-07-21 DIAGNOSIS — E1165 Type 2 diabetes mellitus with hyperglycemia: Secondary | ICD-10-CM | POA: Diagnosis not present

## 2016-08-03 DIAGNOSIS — D0462 Carcinoma in situ of skin of left upper limb, including shoulder: Secondary | ICD-10-CM | POA: Diagnosis not present

## 2016-08-03 DIAGNOSIS — L219 Seborrheic dermatitis, unspecified: Secondary | ICD-10-CM | POA: Diagnosis not present

## 2016-08-03 DIAGNOSIS — C4372 Malignant melanoma of left lower limb, including hip: Secondary | ICD-10-CM | POA: Diagnosis not present

## 2016-08-05 DIAGNOSIS — E782 Mixed hyperlipidemia: Secondary | ICD-10-CM | POA: Diagnosis not present

## 2016-08-05 DIAGNOSIS — Z794 Long term (current) use of insulin: Secondary | ICD-10-CM | POA: Diagnosis not present

## 2016-08-05 DIAGNOSIS — E1122 Type 2 diabetes mellitus with diabetic chronic kidney disease: Secondary | ICD-10-CM | POA: Diagnosis not present

## 2016-08-05 DIAGNOSIS — N183 Chronic kidney disease, stage 3 (moderate): Secondary | ICD-10-CM | POA: Diagnosis not present

## 2016-08-05 DIAGNOSIS — E1165 Type 2 diabetes mellitus with hyperglycemia: Secondary | ICD-10-CM | POA: Diagnosis not present

## 2016-08-08 DIAGNOSIS — E1165 Type 2 diabetes mellitus with hyperglycemia: Secondary | ICD-10-CM | POA: Diagnosis not present

## 2016-08-08 DIAGNOSIS — E1122 Type 2 diabetes mellitus with diabetic chronic kidney disease: Secondary | ICD-10-CM | POA: Diagnosis not present

## 2016-08-08 DIAGNOSIS — Z794 Long term (current) use of insulin: Secondary | ICD-10-CM | POA: Diagnosis not present

## 2016-08-08 DIAGNOSIS — E782 Mixed hyperlipidemia: Secondary | ICD-10-CM | POA: Diagnosis not present

## 2016-08-08 DIAGNOSIS — N183 Chronic kidney disease, stage 3 (moderate): Secondary | ICD-10-CM | POA: Diagnosis not present

## 2016-08-12 DIAGNOSIS — E785 Hyperlipidemia, unspecified: Secondary | ICD-10-CM | POA: Diagnosis not present

## 2016-08-12 DIAGNOSIS — Z6824 Body mass index (BMI) 24.0-24.9, adult: Secondary | ICD-10-CM | POA: Diagnosis not present

## 2016-08-12 DIAGNOSIS — E119 Type 2 diabetes mellitus without complications: Secondary | ICD-10-CM | POA: Diagnosis not present

## 2016-08-12 DIAGNOSIS — Z794 Long term (current) use of insulin: Secondary | ICD-10-CM | POA: Diagnosis not present

## 2016-08-12 DIAGNOSIS — I420 Dilated cardiomyopathy: Secondary | ICD-10-CM | POA: Diagnosis not present

## 2016-08-12 DIAGNOSIS — I483 Typical atrial flutter: Secondary | ICD-10-CM | POA: Diagnosis not present

## 2016-08-14 DIAGNOSIS — E1165 Type 2 diabetes mellitus with hyperglycemia: Secondary | ICD-10-CM | POA: Diagnosis not present

## 2016-08-14 DIAGNOSIS — E1122 Type 2 diabetes mellitus with diabetic chronic kidney disease: Secondary | ICD-10-CM | POA: Diagnosis not present

## 2016-08-14 DIAGNOSIS — Z794 Long term (current) use of insulin: Secondary | ICD-10-CM | POA: Diagnosis not present

## 2016-08-14 DIAGNOSIS — N183 Chronic kidney disease, stage 3 (moderate): Secondary | ICD-10-CM | POA: Diagnosis not present

## 2016-08-20 DIAGNOSIS — E785 Hyperlipidemia, unspecified: Secondary | ICD-10-CM | POA: Diagnosis not present

## 2016-08-20 DIAGNOSIS — Z794 Long term (current) use of insulin: Secondary | ICD-10-CM | POA: Diagnosis not present

## 2016-08-20 DIAGNOSIS — I483 Typical atrial flutter: Secondary | ICD-10-CM | POA: Diagnosis not present

## 2016-08-20 DIAGNOSIS — I420 Dilated cardiomyopathy: Secondary | ICD-10-CM | POA: Diagnosis not present

## 2016-08-20 DIAGNOSIS — E119 Type 2 diabetes mellitus without complications: Secondary | ICD-10-CM | POA: Diagnosis not present

## 2016-09-08 DIAGNOSIS — Z794 Long term (current) use of insulin: Secondary | ICD-10-CM | POA: Diagnosis not present

## 2016-09-08 DIAGNOSIS — E785 Hyperlipidemia, unspecified: Secondary | ICD-10-CM | POA: Diagnosis not present

## 2016-09-08 DIAGNOSIS — E119 Type 2 diabetes mellitus without complications: Secondary | ICD-10-CM | POA: Diagnosis not present

## 2016-09-08 DIAGNOSIS — I420 Dilated cardiomyopathy: Secondary | ICD-10-CM | POA: Diagnosis not present

## 2016-09-08 DIAGNOSIS — I483 Typical atrial flutter: Secondary | ICD-10-CM | POA: Diagnosis not present

## 2016-09-08 DIAGNOSIS — Z6824 Body mass index (BMI) 24.0-24.9, adult: Secondary | ICD-10-CM | POA: Diagnosis not present

## 2016-09-09 DIAGNOSIS — I420 Dilated cardiomyopathy: Secondary | ICD-10-CM | POA: Diagnosis not present

## 2016-10-29 DIAGNOSIS — Z0000 Encounter for general adult medical examination without abnormal findings: Secondary | ICD-10-CM | POA: Diagnosis not present

## 2016-10-29 DIAGNOSIS — N189 Chronic kidney disease, unspecified: Secondary | ICD-10-CM | POA: Diagnosis not present

## 2016-10-29 DIAGNOSIS — G8929 Other chronic pain: Secondary | ICD-10-CM | POA: Diagnosis not present

## 2016-10-29 DIAGNOSIS — Z79899 Other long term (current) drug therapy: Secondary | ICD-10-CM | POA: Diagnosis not present

## 2016-10-29 DIAGNOSIS — M549 Dorsalgia, unspecified: Secondary | ICD-10-CM | POA: Diagnosis not present

## 2016-10-29 DIAGNOSIS — R5383 Other fatigue: Secondary | ICD-10-CM | POA: Diagnosis not present

## 2016-11-05 DIAGNOSIS — R9439 Abnormal result of other cardiovascular function study: Secondary | ICD-10-CM | POA: Diagnosis not present

## 2016-11-18 ENCOUNTER — Other Ambulatory Visit: Payer: Self-pay | Admitting: *Deleted

## 2016-11-18 DIAGNOSIS — I739 Peripheral vascular disease, unspecified: Secondary | ICD-10-CM

## 2016-11-18 DIAGNOSIS — R590 Localized enlarged lymph nodes: Secondary | ICD-10-CM | POA: Diagnosis not present

## 2016-11-18 DIAGNOSIS — R591 Generalized enlarged lymph nodes: Secondary | ICD-10-CM | POA: Diagnosis not present

## 2016-11-18 DIAGNOSIS — Z85820 Personal history of malignant melanoma of skin: Secondary | ICD-10-CM | POA: Diagnosis not present

## 2016-11-26 DIAGNOSIS — R590 Localized enlarged lymph nodes: Secondary | ICD-10-CM | POA: Diagnosis not present

## 2016-11-26 DIAGNOSIS — C4370 Malignant melanoma of unspecified lower limb, including hip: Secondary | ICD-10-CM | POA: Diagnosis not present

## 2016-11-26 DIAGNOSIS — E11649 Type 2 diabetes mellitus with hypoglycemia without coma: Secondary | ICD-10-CM | POA: Diagnosis not present

## 2016-11-26 DIAGNOSIS — Z7901 Long term (current) use of anticoagulants: Secondary | ICD-10-CM | POA: Diagnosis not present

## 2016-11-26 DIAGNOSIS — Z792 Long term (current) use of antibiotics: Secondary | ICD-10-CM | POA: Diagnosis not present

## 2016-11-26 DIAGNOSIS — C774 Secondary and unspecified malignant neoplasm of inguinal and lower limb lymph nodes: Secondary | ICD-10-CM | POA: Diagnosis not present

## 2016-11-26 DIAGNOSIS — C4372 Malignant melanoma of left lower limb, including hip: Secondary | ICD-10-CM | POA: Diagnosis not present

## 2016-11-26 DIAGNOSIS — J439 Emphysema, unspecified: Secondary | ICD-10-CM | POA: Diagnosis not present

## 2016-11-26 DIAGNOSIS — I2510 Atherosclerotic heart disease of native coronary artery without angina pectoris: Secondary | ICD-10-CM | POA: Diagnosis not present

## 2016-11-26 DIAGNOSIS — I509 Heart failure, unspecified: Secondary | ICD-10-CM | POA: Diagnosis not present

## 2016-11-26 DIAGNOSIS — R531 Weakness: Secondary | ICD-10-CM | POA: Diagnosis not present

## 2016-11-26 DIAGNOSIS — I10 Essential (primary) hypertension: Secondary | ICD-10-CM | POA: Diagnosis not present

## 2016-11-26 DIAGNOSIS — I4891 Unspecified atrial fibrillation: Secondary | ICD-10-CM | POA: Diagnosis not present

## 2016-12-01 DIAGNOSIS — Z6822 Body mass index (BMI) 22.0-22.9, adult: Secondary | ICD-10-CM | POA: Diagnosis not present

## 2016-12-01 DIAGNOSIS — Z1389 Encounter for screening for other disorder: Secondary | ICD-10-CM | POA: Diagnosis not present

## 2016-12-01 DIAGNOSIS — E1165 Type 2 diabetes mellitus with hyperglycemia: Secondary | ICD-10-CM | POA: Diagnosis not present

## 2016-12-01 DIAGNOSIS — Z9181 History of falling: Secondary | ICD-10-CM | POA: Diagnosis not present

## 2016-12-02 DIAGNOSIS — C774 Secondary and unspecified malignant neoplasm of inguinal and lower limb lymph nodes: Secondary | ICD-10-CM | POA: Diagnosis not present

## 2016-12-02 DIAGNOSIS — C770 Secondary and unspecified malignant neoplasm of lymph nodes of head, face and neck: Secondary | ICD-10-CM | POA: Diagnosis not present

## 2016-12-02 DIAGNOSIS — C775 Secondary and unspecified malignant neoplasm of intrapelvic lymph nodes: Secondary | ICD-10-CM | POA: Diagnosis not present

## 2016-12-02 DIAGNOSIS — C4370 Malignant melanoma of unspecified lower limb, including hip: Secondary | ICD-10-CM | POA: Diagnosis not present

## 2016-12-02 DIAGNOSIS — C439 Malignant melanoma of skin, unspecified: Secondary | ICD-10-CM | POA: Diagnosis not present

## 2016-12-02 DIAGNOSIS — C773 Secondary and unspecified malignant neoplasm of axilla and upper limb lymph nodes: Secondary | ICD-10-CM | POA: Diagnosis not present

## 2016-12-03 DIAGNOSIS — C774 Secondary and unspecified malignant neoplasm of inguinal and lower limb lymph nodes: Secondary | ICD-10-CM | POA: Diagnosis not present

## 2016-12-03 DIAGNOSIS — C778 Secondary and unspecified malignant neoplasm of lymph nodes of multiple regions: Secondary | ICD-10-CM | POA: Diagnosis not present

## 2016-12-03 DIAGNOSIS — C4372 Malignant melanoma of left lower limb, including hip: Secondary | ICD-10-CM | POA: Diagnosis not present

## 2016-12-08 DIAGNOSIS — I429 Cardiomyopathy, unspecified: Secondary | ICD-10-CM | POA: Diagnosis not present

## 2016-12-08 DIAGNOSIS — E785 Hyperlipidemia, unspecified: Secondary | ICD-10-CM | POA: Diagnosis not present

## 2016-12-08 DIAGNOSIS — Z794 Long term (current) use of insulin: Secondary | ICD-10-CM | POA: Diagnosis not present

## 2016-12-08 DIAGNOSIS — Z6824 Body mass index (BMI) 24.0-24.9, adult: Secondary | ICD-10-CM | POA: Diagnosis not present

## 2016-12-08 DIAGNOSIS — I483 Typical atrial flutter: Secondary | ICD-10-CM | POA: Diagnosis not present

## 2016-12-08 DIAGNOSIS — N183 Chronic kidney disease, stage 3 (moderate): Secondary | ICD-10-CM | POA: Diagnosis not present

## 2016-12-08 DIAGNOSIS — E119 Type 2 diabetes mellitus without complications: Secondary | ICD-10-CM | POA: Diagnosis not present

## 2016-12-11 DIAGNOSIS — C4372 Malignant melanoma of left lower limb, including hip: Secondary | ICD-10-CM | POA: Diagnosis not present

## 2016-12-14 DIAGNOSIS — I10 Essential (primary) hypertension: Secondary | ICD-10-CM | POA: Diagnosis not present

## 2016-12-14 DIAGNOSIS — Z7401 Bed confinement status: Secondary | ICD-10-CM | POA: Diagnosis not present

## 2016-12-14 DIAGNOSIS — Z8679 Personal history of other diseases of the circulatory system: Secondary | ICD-10-CM | POA: Diagnosis not present

## 2016-12-14 DIAGNOSIS — T380X5A Adverse effect of glucocorticoids and synthetic analogues, initial encounter: Secondary | ICD-10-CM | POA: Diagnosis not present

## 2016-12-14 DIAGNOSIS — Z8673 Personal history of transient ischemic attack (TIA), and cerebral infarction without residual deficits: Secondary | ICD-10-CM | POA: Diagnosis not present

## 2016-12-14 DIAGNOSIS — R55 Syncope and collapse: Secondary | ICD-10-CM | POA: Diagnosis not present

## 2016-12-14 DIAGNOSIS — R531 Weakness: Secondary | ICD-10-CM | POA: Diagnosis not present

## 2016-12-14 DIAGNOSIS — I615 Nontraumatic intracerebral hemorrhage, intraventricular: Secondary | ICD-10-CM | POA: Diagnosis present

## 2016-12-14 DIAGNOSIS — Z7984 Long term (current) use of oral hypoglycemic drugs: Secondary | ICD-10-CM | POA: Diagnosis not present

## 2016-12-14 DIAGNOSIS — R5381 Other malaise: Secondary | ICD-10-CM | POA: Diagnosis not present

## 2016-12-14 DIAGNOSIS — I4891 Unspecified atrial fibrillation: Secondary | ICD-10-CM | POA: Diagnosis not present

## 2016-12-14 DIAGNOSIS — C4372 Malignant melanoma of left lower limb, including hip: Secondary | ICD-10-CM | POA: Diagnosis not present

## 2016-12-14 DIAGNOSIS — G911 Obstructive hydrocephalus: Secondary | ICD-10-CM | POA: Diagnosis not present

## 2016-12-14 DIAGNOSIS — E785 Hyperlipidemia, unspecified: Secondary | ICD-10-CM | POA: Diagnosis present

## 2016-12-14 DIAGNOSIS — C779 Secondary and unspecified malignant neoplasm of lymph node, unspecified: Secondary | ICD-10-CM | POA: Diagnosis not present

## 2016-12-14 DIAGNOSIS — Z79899 Other long term (current) drug therapy: Secondary | ICD-10-CM | POA: Diagnosis not present

## 2016-12-14 DIAGNOSIS — Z87891 Personal history of nicotine dependence: Secondary | ICD-10-CM | POA: Diagnosis not present

## 2016-12-14 DIAGNOSIS — I482 Chronic atrial fibrillation: Secondary | ICD-10-CM | POA: Diagnosis present

## 2016-12-14 DIAGNOSIS — R2689 Other abnormalities of gait and mobility: Secondary | ICD-10-CM | POA: Diagnosis not present

## 2016-12-14 DIAGNOSIS — I2510 Atherosclerotic heart disease of native coronary artery without angina pectoris: Secondary | ICD-10-CM | POA: Diagnosis present

## 2016-12-14 DIAGNOSIS — Z8739 Personal history of other diseases of the musculoskeletal system and connective tissue: Secondary | ICD-10-CM | POA: Diagnosis not present

## 2016-12-14 DIAGNOSIS — R739 Hyperglycemia, unspecified: Secondary | ICD-10-CM | POA: Diagnosis not present

## 2016-12-14 DIAGNOSIS — Z8659 Personal history of other mental and behavioral disorders: Secondary | ICD-10-CM | POA: Diagnosis not present

## 2016-12-14 DIAGNOSIS — I517 Cardiomegaly: Secondary | ICD-10-CM | POA: Diagnosis not present

## 2016-12-14 DIAGNOSIS — N183 Chronic kidney disease, stage 3 (moderate): Secondary | ICD-10-CM | POA: Diagnosis present

## 2016-12-14 DIAGNOSIS — I129 Hypertensive chronic kidney disease with stage 1 through stage 4 chronic kidney disease, or unspecified chronic kidney disease: Secondary | ICD-10-CM | POA: Diagnosis present

## 2016-12-14 DIAGNOSIS — Z742 Need for assistance at home and no other household member able to render care: Secondary | ICD-10-CM | POA: Diagnosis present

## 2016-12-14 DIAGNOSIS — Z85820 Personal history of malignant melanoma of skin: Secondary | ICD-10-CM | POA: Diagnosis not present

## 2016-12-14 DIAGNOSIS — Z794 Long term (current) use of insulin: Secondary | ICD-10-CM | POA: Diagnosis not present

## 2016-12-14 DIAGNOSIS — S3992XA Unspecified injury of lower back, initial encounter: Secondary | ICD-10-CM | POA: Diagnosis not present

## 2016-12-14 DIAGNOSIS — S0990XA Unspecified injury of head, initial encounter: Secondary | ICD-10-CM | POA: Diagnosis not present

## 2016-12-14 DIAGNOSIS — C7931 Secondary malignant neoplasm of brain: Secondary | ICD-10-CM | POA: Diagnosis not present

## 2016-12-14 DIAGNOSIS — R296 Repeated falls: Secondary | ICD-10-CM | POA: Diagnosis not present

## 2016-12-14 DIAGNOSIS — C439 Malignant melanoma of skin, unspecified: Secondary | ICD-10-CM | POA: Diagnosis not present

## 2016-12-14 DIAGNOSIS — Z66 Do not resuscitate: Secondary | ICD-10-CM | POA: Diagnosis not present

## 2016-12-14 DIAGNOSIS — Z515 Encounter for palliative care: Secondary | ICD-10-CM | POA: Diagnosis not present

## 2016-12-14 DIAGNOSIS — Z7901 Long term (current) use of anticoagulants: Secondary | ICD-10-CM | POA: Diagnosis not present

## 2016-12-15 DIAGNOSIS — C7931 Secondary malignant neoplasm of brain: Secondary | ICD-10-CM | POA: Diagnosis not present

## 2016-12-20 DEATH — deceased

## 2016-12-21 ENCOUNTER — Telehealth: Payer: Self-pay | Admitting: Vascular Surgery

## 2017-01-12 ENCOUNTER — Encounter (HOSPITAL_COMMUNITY): Payer: Medicare Other

## 2017-01-12 ENCOUNTER — Encounter: Payer: Medicare Other | Admitting: Vascular Surgery

## 2017-01-20 DEATH — deceased

## 2017-01-26 ENCOUNTER — Encounter (HOSPITAL_COMMUNITY): Payer: Medicare Other

## 2017-01-26 ENCOUNTER — Encounter: Payer: Medicare Other | Admitting: Vascular Surgery
